# Patient Record
Sex: Male | Born: 1945 | Race: Black or African American | Hispanic: No | Marital: Married | Smoking: Never smoker
Health system: Southern US, Community
[De-identification: ages and names within clinical notes are randomized; demographics above are authoritative.]

## PROBLEM LIST (undated history)

## (undated) DIAGNOSIS — I10 Essential (primary) hypertension: Secondary | ICD-10-CM

## (undated) DIAGNOSIS — N289 Disorder of kidney and ureter, unspecified: Secondary | ICD-10-CM

## (undated) DIAGNOSIS — Z992 Dependence on renal dialysis: Secondary | ICD-10-CM

## (undated) DIAGNOSIS — E119 Type 2 diabetes mellitus without complications: Secondary | ICD-10-CM

## (undated) HISTORY — PX: AV FISTULA PLACEMENT: SHX1204

## (undated) HISTORY — PX: KIDNEY TRANSPLANT: SHX239

---

## 2019-12-27 ENCOUNTER — Other Ambulatory Visit: Payer: Self-pay

## 2019-12-27 ENCOUNTER — Emergency Department (HOSPITAL_BASED_OUTPATIENT_CLINIC_OR_DEPARTMENT_OTHER): Payer: Medicare HMO

## 2019-12-27 ENCOUNTER — Encounter (HOSPITAL_BASED_OUTPATIENT_CLINIC_OR_DEPARTMENT_OTHER): Payer: Self-pay

## 2019-12-27 ENCOUNTER — Observation Stay (HOSPITAL_BASED_OUTPATIENT_CLINIC_OR_DEPARTMENT_OTHER)
Admission: EM | Admit: 2019-12-27 | Discharge: 2019-12-30 | Disposition: A | Discharge status: Home / Self Care | Payer: Medicare HMO | Attending: Urology | Admitting: Urology

## 2019-12-27 DIAGNOSIS — I120 Hypertensive chronic kidney disease with stage 5 chronic kidney disease or end stage renal disease: Secondary | ICD-10-CM | POA: Insufficient documentation

## 2019-12-27 DIAGNOSIS — Z79899 Other long term (current) drug therapy: Secondary | ICD-10-CM | POA: Insufficient documentation

## 2019-12-27 DIAGNOSIS — S301XXA Contusion of abdominal wall, initial encounter: Secondary | ICD-10-CM | POA: Insufficient documentation

## 2019-12-27 DIAGNOSIS — Z992 Dependence on renal dialysis: Secondary | ICD-10-CM | POA: Insufficient documentation

## 2019-12-27 DIAGNOSIS — S3131XA Laceration without foreign body of scrotum and testes, initial encounter: Principal | ICD-10-CM | POA: Insufficient documentation

## 2019-12-27 DIAGNOSIS — E1122 Type 2 diabetes mellitus with diabetic chronic kidney disease: Secondary | ICD-10-CM | POA: Insufficient documentation

## 2019-12-27 DIAGNOSIS — Z23 Encounter for immunization: Secondary | ICD-10-CM | POA: Insufficient documentation

## 2019-12-27 DIAGNOSIS — K8020 Calculus of gallbladder without cholecystitis without obstruction: Secondary | ICD-10-CM | POA: Diagnosis not present

## 2019-12-27 DIAGNOSIS — Z794 Long term (current) use of insulin: Secondary | ICD-10-CM | POA: Insufficient documentation

## 2019-12-27 DIAGNOSIS — Y9389 Activity, other specified: Secondary | ICD-10-CM | POA: Insufficient documentation

## 2019-12-27 DIAGNOSIS — Z7952 Long term (current) use of systemic steroids: Secondary | ICD-10-CM | POA: Diagnosis not present

## 2019-12-27 DIAGNOSIS — N281 Cyst of kidney, acquired: Secondary | ICD-10-CM | POA: Diagnosis not present

## 2019-12-27 DIAGNOSIS — N186 End stage renal disease: Secondary | ICD-10-CM | POA: Diagnosis present

## 2019-12-27 DIAGNOSIS — W010XXA Fall on same level from slipping, tripping and stumbling without subsequent striking against object, initial encounter: Secondary | ICD-10-CM | POA: Insufficient documentation

## 2019-12-27 DIAGNOSIS — Z7982 Long term (current) use of aspirin: Secondary | ICD-10-CM | POA: Insufficient documentation

## 2019-12-27 DIAGNOSIS — T148XXA Other injury of unspecified body region, initial encounter: Secondary | ICD-10-CM | POA: Diagnosis present

## 2019-12-27 DIAGNOSIS — E875 Hyperkalemia: Secondary | ICD-10-CM | POA: Insufficient documentation

## 2019-12-27 DIAGNOSIS — Z20822 Contact with and (suspected) exposure to covid-19: Secondary | ICD-10-CM | POA: Insufficient documentation

## 2019-12-27 DIAGNOSIS — D631 Anemia in chronic kidney disease: Secondary | ICD-10-CM | POA: Insufficient documentation

## 2019-12-27 DIAGNOSIS — R262 Difficulty in walking, not elsewhere classified: Secondary | ICD-10-CM | POA: Insufficient documentation

## 2019-12-27 DIAGNOSIS — S3022XA Contusion of scrotum and testes, initial encounter: Secondary | ICD-10-CM

## 2019-12-27 DIAGNOSIS — Z940 Kidney transplant status: Secondary | ICD-10-CM | POA: Insufficient documentation

## 2019-12-27 DIAGNOSIS — Z7902 Long term (current) use of antithrombotics/antiplatelets: Secondary | ICD-10-CM | POA: Diagnosis not present

## 2019-12-27 DIAGNOSIS — Y9289 Other specified places as the place of occurrence of the external cause: Secondary | ICD-10-CM | POA: Insufficient documentation

## 2019-12-27 HISTORY — DX: Type 2 diabetes mellitus without complications: E11.9

## 2019-12-27 HISTORY — DX: Essential (primary) hypertension: I10

## 2019-12-27 HISTORY — DX: Dependence on renal dialysis: Z99.2

## 2019-12-27 HISTORY — DX: Disorder of kidney and ureter, unspecified: N28.9

## 2019-12-27 LAB — BASIC METABOLIC PANEL
Anion gap: 13 (ref 5–15)
BUN: 35 mg/dL — ABNORMAL HIGH (ref 8–23)
CO2: 24 mmol/L (ref 22–32)
Calcium: 8.4 mg/dL — ABNORMAL LOW (ref 8.9–10.3)
Chloride: 101 mmol/L (ref 98–111)
Creatinine, Ser: 8.39 mg/dL — ABNORMAL HIGH (ref 0.61–1.24)
GFR calc Af Amer: 7 mL/min — ABNORMAL LOW (ref >60)
GFR calc non Af Amer: 6 mL/min — ABNORMAL LOW (ref >60)
Glucose, Bld: 153 mg/dL — ABNORMAL HIGH (ref 70–99)
Potassium: 4.8 mmol/L (ref 3.5–5.1)
Sodium: 138 mmol/L (ref 135–145)

## 2019-12-27 LAB — CBC
HCT: 43.4 % (ref 39.0–52.0)
Hemoglobin: 13.9 g/dL (ref 13.0–17.0)
MCH: 32.6 pg (ref 26.0–34.0)
MCHC: 32 g/dL (ref 30.0–36.0)
MCV: 101.6 fL — ABNORMAL HIGH (ref 80.0–100.0)
Platelets: 183 10*3/uL (ref 150–400)
RBC: 4.27 MIL/uL (ref 4.22–5.81)
RDW: 16.7 % — ABNORMAL HIGH (ref 11.5–15.5)
WBC: 7.2 10*3/uL (ref 4.0–10.5)
nRBC: 0 % (ref 0.0–0.2)

## 2019-12-27 LAB — RESPIRATORY PANEL BY RT PCR (FLU A&B, COVID)
Influenza A by PCR: NEGATIVE
Influenza B by PCR: NEGATIVE
SARS Coronavirus 2 by RT PCR: NEGATIVE

## 2019-12-27 LAB — CBG MONITORING, ED: Glucose-Capillary: 106 mg/dL — ABNORMAL HIGH (ref 70–99)

## 2019-12-27 MED ORDER — MORPHINE SULFATE (PF) 4 MG/ML IV SOLN
4.0000 mg | Freq: Once | INTRAVENOUS | Status: AC
  Administered 2019-12-27: 19:00:00 4 mg via INTRAVENOUS
  Filled 2019-12-27: qty 1

## 2019-12-27 MED ORDER — SODIUM CHLORIDE 0.9 % IV SOLN: INTRAVENOUS | Status: DC

## 2019-12-27 MED ORDER — TETANUS-DIPHTH-ACELL PERTUSSIS 5-2.5-18.5 LF-MCG/0.5 IM SUSP
0.5000 mL | Freq: Once | INTRAMUSCULAR | Status: AC
  Administered 2019-12-27: 0.5 mL via INTRAMUSCULAR
  Filled 2019-12-27: qty 0.5

## 2019-12-27 MED ORDER — ONDANSETRON HCL 4 MG/2ML IJ SOLN
4.0000 mg | Freq: Once | INTRAMUSCULAR | Status: AC
  Administered 2019-12-27: 4 mg via INTRAVENOUS
  Filled 2019-12-27: qty 2

## 2019-12-27 MED ORDER — HYDROMORPHONE HCL 1 MG/ML IJ SOLN
1.0000 mg | Freq: Once | INTRAMUSCULAR | Status: AC
  Administered 2019-12-27: 1 mg via INTRAVENOUS
  Filled 2019-12-27: qty 1

## 2019-12-27 MED ORDER — CEFAZOLIN SODIUM-DEXTROSE 1-4 GM/50ML-% IV SOLN
1.0000 g | Freq: Once | INTRAVENOUS | Status: AC
  Administered 2019-12-27: 1 g via INTRAVENOUS
  Filled 2019-12-27: qty 50

## 2019-12-27 MED ORDER — IOHEXOL 300 MG/ML  SOLN
100.0000 mL | Freq: Once | INTRAMUSCULAR | Status: AC | PRN
  Administered 2019-12-27: 20:00:00 100 mL via INTRAVENOUS

## 2019-12-27 NOTE — ED Provider Notes (Signed)
[facility] EMERGENCY DEPARTMENT Provider Note   CSN: [id number] Arrival date & time: 12/27/19  1810     History Chief Complaint  Patient presents with  . Fall    Mathew Pierce is a 74 y.o. male.  Patient presents s/p fall and injury to right scrotum and pelvis area. Pt indicates was fishing this afternoon, when tripped on the pole, and fell back into a pole that was sticking upwards from a wagon. Pole impaled him in region right scrotum, and he c/o pain to area, blood in underwear, and swelling to right groin and suprapubic area. Symptoms acute onset, moderate, persistent, dull. Denies large volume of bleeding/stopped on its own. 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 No nausea/vomiting. No head injury or headache. No neck or back pain. No anticoag use. Denies other pain or injury. Last tetanus unknown.   The history is provided by the patient.  Fall Pertinent negatives include no chest pain, no abdominal pain, no headaches and no shortness of breath.       Past Medical History:  Diagnosis Date  . Diabetes mellitus without complication ([place])   . Dialysis patient ([place])   . Hypertension   . Renal disorder     There are no problems to display for this patient.   History reviewed. No pertinent surgical history.     No family history on file.  Social History   Tobacco Use  . Smoking status: Never Smoker  . Smokeless tobacco: Never Used  Substance Use Topics  . Alcohol use: Never  . Drug use: Never    Home Medications Prior to Admission medications   Not on File    Allergies    Patient has no known allergies.  Review of Systems   Review of Systems  Constitutional: Negative for fever.  HENT: Negative for nosebleeds.   Eyes: Negative for redness.  Respiratory: Negative for shortness of breath.   Cardiovascular: Negative for chest pain.  Gastrointestinal: Negative for  abdominal pain, nausea and vomiting.  Genitourinary: Positive for scrotal swelling. Negative for flank pain.  Musculoskeletal: Negative for back pain and neck pain.  Skin: Positive for wound.  Neurological: Negative for weakness, numbness and headaches.  Hematological: Does not bruise/bleed easily.  Psychiatric/Behavioral: Negative for confusion.    Physical Exam Updated Vital Signs BP (!) 149/76 (BP Location: Left Arm)   Pulse 76   Temp 97.6 F (36.4 C) (Oral)   Resp 18   Ht 1.702 m (5\' 7" )   Wt 79.8 kg   SpO2 98%   BMI 27.57 kg/m   Physical Exam Vitals and nursing note reviewed.  Constitutional:      Appearance: Normal appearance. He is well-developed.  HENT:     Head: Atraumatic.     Nose: Nose normal.     Mouth/Throat:     Mouth: Mucous membranes are moist.     Pharynx: Oropharynx is clear.  Eyes:     General: No scleral icterus.    Conjunctiva/sclera: Conjunctivae normal.     Pupils: Pupils are equal, round, and reactive to light.  Neck:     Trachea: No tracheal deviation.  Cardiovascular:     Rate and Rhythm: Normal rate and regular rhythm.     Pulses: Normal pulses.     Heart sounds: Normal heart sounds. No murmur. No friction rub. No gallop.   Pulmonary:     Effort: Pulmonary effort  is normal. No accessory muscle usage or respiratory distress.     Breath sounds: Normal breath sounds.  Chest:     Chest wall: No tenderness.  Abdominal:     General: Bowel sounds are normal. There is no distension.     Palpations: Abdomen is soft.     Tenderness: There is no abdominal tenderness. There is no guarding.     Comments: No abd wall bruising or contusion. +suprapubic swelling ?hematoma.   Genitourinary:    Comments: No cva tenderness. 3 cm laceration to inferior aspect right scrotum. Swelling to right scrotum, right groin, and suprapubic area.  Musculoskeletal:        General: No swelling.     Cervical back: Normal range of motion and neck supple. No rigidity.      Comments: CTLS spine, non tender, aligned, no step off. Good rom bil extremities without pain or other focal bony tenderness.  Skin:    General: Skin is warm and dry.     Findings: No rash.  Neurological:     Mental Status: He is alert.     Comments: Alert, speech clear. Motor/sens grossly intact. Steady gait.   Psychiatric:        Mood and Affect: Mood normal.     ED Results / Procedures / Treatments   Labs (all labs ordered are listed, but only abnormal results are displayed) Results for orders placed or performed during the hospital encounter of 12/27/19  CBC  Result Value Ref Range   WBC 7.2 4.0 - 10.5 K/uL   RBC 4.27 4.22 - 5.81 MIL/uL   Hemoglobin 13.9 13.0 - 17.0 g/dL   HCT 43.4 39.0 - 52.0 %   MCV 101.6 (H) 80.0 - 100.0 fL   MCH 32.6 26.0 - 34.0 pg   MCHC 32.0 30.0 - 36.0 g/dL   RDW 16.7 (H) 11.5 - 15.5 %   Platelets 183 150 - 400 K/uL   nRBC 0.0 0.0 - 0.2 %  Basic metabolic panel  Result Value Ref Range   Sodium 138 135 - 145 mmol/L   Potassium 4.8 3.5 - 5.1 mmol/L   Chloride 101 98 - 111 mmol/L   CO2 24 22 - 32 mmol/L   Glucose, Bld 153 (H) 70 - 99 mg/dL   BUN 35 (H) 8 - 23 mg/dL   Creatinine, Ser 8.39 (H) 0.61 - 1.24 mg/dL   Calcium 8.4 (L) 8.9 - 10.3 mg/dL   GFR calc non Af Amer 6 (L) >60 mL/min   GFR calc Af Amer 7 (L) >60 mL/min   Anion gap 13 5 - 15  CBG monitoring, ED  Result Value Ref Range   Glucose-Capillary 106 (H) 70 - 99 mg/dL   CT Abdomen Pelvis W Contrast  Addendum Date: 12/27/2019   ADDENDUM REPORT: 12/27/2019 20:54 ADDENDUM: Results were discussed with Dr. Arie Sabina at 8:42 p.m. [place] on December 27, 2019. Electronically Signed   By: Virgina Norfolk M.D.   On: 12/27/2019 20:54   Result Date: 12/27/2019 CLINICAL DATA:  Status post trauma. EXAM: CT ABDOMEN AND PELVIS WITH CONTRAST TECHNIQUE: Multidetector CT imaging of the abdomen and pelvis was performed using the standard protocol following bolus administration of intravenous contrast.  CONTRAST:  146mL OMNIPAQUE IOHEXOL 300 MG/ML  SOLN COMPARISON:  April 21, 2019 FINDINGS: Lower chest: No acute abnormality. Hepatobiliary: No focal liver abnormality is seen. Gallstones and heterogeneous sludge are seen within the lumen of a contracted gallbladder. There is no evidence of biliary dilatation.  Pancreas: Unremarkable. No pancreatic ductal dilatation or surrounding inflammatory changes. Spleen: Normal in size without focal abnormality. Adrenals/Urinary Tract: Adrenal glands are unremarkable. The native kidneys are very small, without renal calculi or hydronephrosis. Multiple bilateral renal cysts are seen. A renal transplant is seen within the right hemipelvis. Diffuse cortical thinning of the transplanted kidney is noted. 1.2 cm and 1.0 cm renal cysts are also seen. Mild diffuse urinary bladder wall thickening is seen. Stomach/Bowel: Stomach is within normal limits. Appendix appears normal. No evidence of bowel wall thickening, distention, or inflammatory changes. Vascular/Lymphatic: There is moderate to marked severity aortic calcification and atherosclerosis. No enlarged abdominal or pelvic lymph nodes. Reproductive: Prostate is unremarkable. Other: A moderate to marked amount of para muscular soft tissue air is seen within the deep subcutaneous fat along the anterior aspect of the lower pelvic wall on the right. This extends into the right inguinal region and is seen along the superior aspect of the scrotum on the right. 6.4 cm x 4.6 cm and 2.8 cm x 2.2 cm areas of mildly increased attenuation are also seen within this portion of subcutaneous fat. A 2.3 cm linear area of contrast enhancement is seen anterior to these areas (axial CT images 78 through 80, CT series number 2). The larger area of increased attenuation extends inferiorly along the right groin and superior aspect of the scrotum on the right. A moderate to marked amount of surrounding inflammatory fat stranding is seen. A moderate amount  of heterogeneous increased attenuation is noted throughout the scrotum on the right. Diffuse scrotal wall edema is also seen. The right testicle is poorly identified. Musculoskeletal: Moderate severity multilevel changes seen throughout the lumbar spine. IMPRESSION: 1. Moderate to marked amount of lower right anterior pelvic wall, right inguinal and right scrotal soft tissue air with associated hematomas and acute scrotal blood. The 2.3 cm linear area of contrast enhancement seen anterior to the pelvic wall and inguinal hematomas is consistent with a focus of active bleeding. Subsequent rupture of the right testicle cannot be excluded. 2. Cholelithiasis and heterogeneous sludge within a contracted gallbladder. 3. 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 4. Moderate to marked severity aortic calcification and atherosclerosis. 5. Mild diffuse urinary bladder wall thickening. Correlation with urinalysis is recommended to exclude cystitis. Aortic Atherosclerosis (ICD10-I70.0). Electronically Signed: By: Virgina Norfolk M.D. On: 12/27/2019 20:47    EKG None  Radiology No results found.  Procedures Procedures (including critical care time)  Medications Ordered in ED Medications  0.9 %  sodium chloride infusion (has no administration in time range)  Tdap (BOOSTRIX) injection 0.5 mL (has no administration in time range)  ceFAZolin (ANCEF) IVPB 1 g/50 mL premix (has no administration in time range)  morphine 4 MG/ML injection 4 mg (4 mg Intravenous Given 12/27/19 1857)  ondansetron (ZOFRAN) injection 4 mg (4 mg Intravenous Given 12/27/19 1856)    ED Course  I have reviewed the triage vital signs and the nursing notes.  Pertinent labs & imaging results that were available during my care of the patient were reviewed by me and considered in my medical decision making (see chart for details).        MDM Rules/Calculators/A&P                      Iv  ns. Morphine iv. zofran iv. Tetanus im. Labs and imaging ordered.   Reviewed nursing notes and prior charts for additional history.   Pain  improved w meds.   Labs reviewed/interpreted by me - hgb normal.   CT reviewed/interpreted by me - large scrotal, inguinal/suprapubic hematomas w active extravasation. ?right testicle injury vs displacement.   Urology consulted - discussed w Dr Junious Silk - he reviewed CTs and states transfer to trauma service at [facility]. He will consult.   Discussed with trauma MD at [facility], Dr Bobbye Morton - she accepts in transfer - they will eval in ED and decide on observation vs possible IR procedure then.   Pt agreeable w transfer to [facility].   Ancef iv.   Additional pain medication given.   CRITICAL CARE RE: acute injury to right scrotum, with laceration to scrotum, large hematoma with active extravasation requiring emergent trauma surgeon and urology consultation, transfer to trauma center for possible exploration and/or IR procedure, ESRD/HD patient.  Performed by: Mirna Mires Total critical care time: 40 minutes Critical care time was exclusive of separately billable procedures and treating other patients. Critical care was necessary to treat or prevent imminent or life-threatening deterioration. Critical care was time spent personally by me on the following activities: development of treatment plan with patient and/or surrogate as well as nursing, discussions with consultants, evaluation of patient's response to treatment, examination of patient, obtaining history from patient or surrogate, ordering and performing treatments and interventions, ordering and review of laboratory studies, ordering and review of radiographic studies, pulse oximetry and re-evaluation of patient's condition.      Final Clinical Impression(s) / ED Diagnoses Final diagnoses:  None    Rx / DC Orders ED Discharge Orders    None       Lajean Saver, MD 12/27/19 2134

## 2019-12-27 NOTE — ED Provider Notes (Signed)
Pt transferred from [facility] for a trauma eval.  Pt d/w Dr. Bobbye Morton who will see him.   Isla Pence, MD 12/27/19 2302

## 2019-12-27 NOTE — ED Notes (Signed)
[facility] notified (Taryn) - patient ready for transport 

## 2019-12-27 NOTE — H&P (Addendum)
TRAUMA H&P  12/27/2019, 11:04 PM   Chief Complaint: scrotal bleeding, transfer from [facility] Consultant: Gilford Raid, MD  The patient is an 74 y.o. male.   HPI: 79M s/p mechanical GLF while out fishing this evening. Reports tripping over his fishing rods and falling onto a wagon that had poles extending upward at each corner. Denies hitting/injuring anything other than his scrotum/groin. Denies LOC, no CP/SOB/dizziness prior to fall. Dialysis patient, RUE AVF, TTS, last complete session 3/20.  Past Medical History:  Diagnosis Date  . Diabetes mellitus without complication ([place])   . Dialysis patient ([place])   . Hypertension   . Renal disorder     History reviewed. No pertinent surgical history.  No pertinent family history.  Social History:  reports that he has never smoked. He has never used smokeless tobacco. He reports that he does not drink alcohol or use drugs.    Allergies: No Known Allergies  Medications: reviewed  Results for orders placed or performed during the hospital encounter of 12/27/19 (from the past 48 hour(s))  CBC     Status: Abnormal   Collection Time: 12/27/19  7:00 PM  Result Value Ref Range   WBC 7.2 4.0 - 10.5 K/uL   RBC 4.27 4.22 - 5.81 MIL/uL   Hemoglobin 13.9 13.0 - 17.0 g/dL   HCT 43.4 39.0 - 52.0 %   MCV 101.6 (H) 80.0 - 100.0 fL   MCH 32.6 26.0 - 34.0 pg   MCHC 32.0 30.0 - 36.0 g/dL   RDW 16.7 (H) 11.5 - 15.5 %   Platelets 183 150 - 400 K/uL   nRBC 0.0 0.0 - 0.2 %    Comment: Performed at [facility], [address] [place], [place] [address]  Basic metabolic panel     Status: Abnormal   Collection Time: 12/27/19  7:00 PM  Result Value Ref Range   Sodium 138 135 - 145 mmol/L   Potassium 4.8 3.5 - 5.1 mmol/L   Chloride 101 98 - 111 mmol/L   CO2 24 22 - 32 mmol/L   Glucose, Bld 153 (H) 70 - 99 mg/dL    Comment: Glucose reference range applies only to samples taken after fasting for at least 8 hours.   BUN 35 (H) 8 - 23 mg/dL   Creatinine, Ser 8.39 (H) 0.61 - 1.24 mg/dL   Calcium 8.4 (L) 8.9 - 10.3 mg/dL   GFR calc non Af Amer 6 (L) >60 mL/min   GFR calc Af Amer 7 (L) >60 mL/min   Anion gap 13 5 - 15    Comment: Performed at [facility], [address] [place], [place] [address]  CBG monitoring, ED     Status: Abnormal   Collection Time: 12/27/19  9:08 PM  Result Value Ref Range   Glucose-Capillary 106 (H) 70 - 99 mg/dL    Comment: Glucose reference range applies only to samples taken after fasting for at least 8 hours.  Respiratory Panel by RT PCR (Flu A&B, Covid) - Nasopharyngeal Swab     Status: None   Collection Time: 12/27/19  9:53 PM   Specimen: Nasopharyngeal Swab  Result Value Ref Range   SARS Coronavirus 2 by RT PCR NEGATIVE NEGATIVE    Comment: (NOTE) SARS-CoV-2 target nucleic acids are NOT DETECTED. The SARS-CoV-2 RNA is generally detectable in upper respiratoy specimens during the acute phase of infection. The lowest concentration of SARS-CoV-2 viral copies this assay can detect is 131 copies/mL. A  negative result does not preclude SARS-Cov-2 infection and should not be used as the sole basis for treatment or other patient management decisions. A negative result may occur with  improper specimen collection/handling, submission of specimen other than nasopharyngeal swab, presence of viral mutation(s) within the areas targeted by this assay, and inadequate number of viral copies (<131 copies/mL). A negative result must be combined with clinical observations, patient history, and epidemiological information. The expected result is Negative. Fact Sheet for Patients:  PinkCheek.be Fact Sheet for Healthcare Providers:  GravelBags.it This test is not yet ap proved or cleared by the [place] FDA and  has been authorized for detection and/or diagnosis of SARS-CoV-2 by FDA under an Emergency Use Authorization (EUA). This EUA  will remain  in effect (meaning this test can be used) for the duration of the COVID-19 declaration under Section 564(b)(1) of the Act, 21 U.S.C. section 360bbb-3(b)(1), unless the authorization is terminated or revoked sooner.    Influenza A by PCR NEGATIVE NEGATIVE   Influenza B by PCR NEGATIVE NEGATIVE    Comment: (NOTE) The Xpert Xpress SARS-CoV-2/FLU/RSV assay is intended as an aid in  the diagnosis of influenza from Nasopharyngeal swab specimens and  should not be used as a sole basis for treatment. Nasal washings and  aspirates are unacceptable for Xpert Xpress SARS-CoV-2/FLU/RSV  testing. Fact Sheet for Patients: PinkCheek.be Fact Sheet for Healthcare Providers: GravelBags.it This test is not yet approved or cleared by the [place] FDA and  has been authorized for detection and/or diagnosis of SARS-CoV-2 by  FDA under an Emergency Use Authorization (EUA). This EUA will remain  in effect (meaning this test can be used) for the duration of the  Covid-19 declaration under Section 564(b)(1) of the Act, 21  U.S.C. section 360bbb-3(b)(1), unless the authorization is  terminated or revoked. Performed at [facility], [address] [place], [place] [address]     CT Abdomen Pelvis W Contrast  Addendum Date: 12/27/2019   ADDENDUM REPORT: 12/27/2019 20:54 ADDENDUM: Results were discussed with Dr. Arie Sabina at 8:42 p.m. [place] on December 27, 2019. Electronically Signed   By: Virgina Norfolk M.D.   On: 12/27/2019 20:54   Result Date: 12/27/2019 CLINICAL DATA:  Status post trauma. EXAM: CT ABDOMEN AND PELVIS WITH CONTRAST TECHNIQUE: Multidetector CT imaging of the abdomen and pelvis was performed using the standard protocol following bolus administration of intravenous contrast. CONTRAST:  15mL OMNIPAQUE IOHEXOL 300 MG/ML  SOLN COMPARISON:  April 21, 2019 FINDINGS: Lower chest: No acute abnormality.  Hepatobiliary: No focal liver abnormality is seen. Gallstones and heterogeneous sludge are seen within the lumen of a contracted gallbladder. There is no evidence of biliary dilatation. Pancreas: Unremarkable. No pancreatic ductal dilatation or surrounding inflammatory changes. Spleen: Normal in size without focal abnormality. Adrenals/Urinary Tract: Adrenal glands are unremarkable. The native kidneys are very small, without renal calculi or hydronephrosis. Multiple bilateral renal cysts are seen. A renal transplant is seen within the right hemipelvis. Diffuse cortical thinning of the transplanted kidney is noted. 1.2 cm and 1.0 cm renal cysts are also seen. Mild diffuse urinary bladder wall thickening is seen. Stomach/Bowel: Stomach is within normal limits. Appendix appears normal. No evidence of bowel wall thickening, distention, or inflammatory changes. Vascular/Lymphatic: There is moderate to marked severity aortic calcification and atherosclerosis. No enlarged abdominal or pelvic lymph nodes. Reproductive: Prostate is unremarkable. Other: A moderate to marked amount of para muscular soft tissue air is seen within the deep subcutaneous fat  along the anterior aspect of the lower pelvic wall on the right. This extends into the right inguinal region and is seen along the superior aspect of the scrotum on the right. 6.4 cm x 4.6 cm and 2.8 cm x 2.2 cm areas of mildly increased attenuation are also seen within this portion of subcutaneous fat. A 2.3 cm linear area of contrast enhancement is seen anterior to these areas (axial CT images 78 through 80, CT series number 2). The larger area of increased attenuation extends inferiorly along the right groin and superior aspect of the scrotum on the right. A moderate to marked amount of surrounding inflammatory fat stranding is seen. A moderate amount of heterogeneous increased attenuation is noted throughout the scrotum on the right. Diffuse scrotal wall edema is also  seen. The right testicle is poorly identified. Musculoskeletal: Moderate severity multilevel changes seen throughout the lumbar spine. IMPRESSION: 1. Moderate to marked amount of lower right anterior pelvic wall, right inguinal and right scrotal soft tissue air with associated hematomas and acute scrotal blood. The 2.3 cm linear area of contrast enhancement seen anterior to the pelvic wall and inguinal hematomas is consistent with a focus of active bleeding. Subsequent rupture of the right testicle cannot be excluded. 2. Cholelithiasis and heterogeneous sludge within a contracted gallbladder. 3. 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 4. Moderate to marked severity aortic calcification and atherosclerosis. 5. Mild diffuse urinary bladder wall thickening. Correlation with urinalysis is recommended to exclude cystitis. Aortic Atherosclerosis (ICD10-I70.0). Electronically Signed: By: Virgina Norfolk M.D. On: 12/27/2019 20:47    ROS 10 point review of systems is negative except as listed above in HPI.  Blood pressure (!) 163/62, pulse 63, temperature 98 F (36.7 C), temperature source Oral, resp. rate 16, height 5\' 7"  (1.702 m), weight 79.8 kg, SpO2 97 %.  Secondary Survey:  GCS: E(4)//V(5)//M(6) Constitutional: well-developed, well-nourished Skull: normocephalic, atraumatic Eyes: pupils equal, round, reactive to light, 54mm b/l, moist conjunctiva Face/ENT: midface stable without deformity, normal dentition ENT: external inspection of ears and nose normal, hearing intact Oropharynx: normal oropharyngeal mucosa, no blood Neck: no thyromegaly, trachea midline, c-collar not applied due to mechanism, no midline cervical tenderness to palpation, no C-spine stepoffs Chest: breath sounds equal bilaterally, normal respiratory effort, no midline or lateral chest wall tenderness to palpation/deformity Abdomen: soft, NT, no bruising, no hepatosplenomegaly FAST:  not performed Pelvis: stable GU: no blood at urethral meatus of penis, R scrotum tender with 6cm V-shaped laceration, hemostatic, R groin with tender 4cm hematoma present, no overlying skin breakdown Back: no wounds, no T/L spine TTP, no T/L spine stepoffs Rectal: deferred Extremities: 2+ radial pulses bilaterally, pedal pulses equal bilaterally, motor and sensation intact to bilateral UE and LE, no peripheral edema, RUE AVF with thrill MSK: unable to assess gait/station no clubbing/cyanosis of fingers/toes, limited ROM of RLE 2/2 pain, otherwise normal ROM of remaining extremities, no deformities Skin: warm, dry, no rashes    Assessment/Plan: Problem List 20M s/p GLF  Plan Groin/scrotal hematoma with active extravasation on imaging - serial CBC, IR c/s for angio, warm compresses Scrotal laceration, concern for ruptured testicle on imaging - Urology c/s (Dr. Junious Silk) ESRD on HD - c/s Renal in AM for HD on 3/23 FEN - CLD DVT - SCDs, hold chemical ppx due to clinical condition Dispo - Admit to inpatient--floor  Family update: wife at bedside, updated  Jesusita Oka, MD General and Junior Surgery

## 2019-12-27 NOTE — ED Notes (Signed)
Pt. Doesn't make urine, except for a few drops every 4-5 days at best.

## 2019-12-27 NOTE — ED Triage Notes (Signed)
Pt arrives with report of a fall about an hour PTA. States that he fell on a pole and has had some bleeding, unsure of where it punctured. Pt states his tetanus shot is up to date in last five years. Pt is on T, TH., Sat, dialysis. Pt had full treatment yesterday, access is in right arm.

## 2019-12-27 NOTE — ED Notes (Addendum)
CBG 106 

## 2019-12-27 NOTE — ED Notes (Signed)
ED Provider at bedside. 

## 2019-12-27 NOTE — ED Notes (Signed)
PT is on Dialysis and does not urinate every day.

## 2019-12-28 DIAGNOSIS — T148XXA Other injury of unspecified body region, initial encounter: Secondary | ICD-10-CM | POA: Diagnosis present

## 2019-12-28 LAB — CBC
HCT: 38.9 % — ABNORMAL LOW (ref 39.0–52.0)
HCT: 37.7 % — ABNORMAL LOW (ref 39.0–52.0)
HCT: 32.8 % — ABNORMAL LOW (ref 39.0–52.0)
Hemoglobin: 12.2 g/dL — ABNORMAL LOW (ref 13.0–17.0)
Hemoglobin: 12.2 g/dL — ABNORMAL LOW (ref 13.0–17.0)
Hemoglobin: 10.5 g/dL — ABNORMAL LOW (ref 13.0–17.0)
MCH: 31.5 pg (ref 26.0–34.0)
MCH: 32 pg (ref 26.0–34.0)
MCH: 31.6 pg (ref 26.0–34.0)
MCHC: 31.4 g/dL (ref 30.0–36.0)
MCHC: 32.4 g/dL (ref 30.0–36.0)
MCHC: 32 g/dL (ref 30.0–36.0)
MCV: 100.5 fL — ABNORMAL HIGH (ref 80.0–100.0)
MCV: 99 fL (ref 80.0–100.0)
MCV: 98.8 fL (ref 80.0–100.0)
Platelets: 179 10*3/uL (ref 150–400)
Platelets: 141 10*3/uL — ABNORMAL LOW (ref 150–400)
Platelets: 141 10*3/uL — ABNORMAL LOW (ref 150–400)
RBC: 3.87 MIL/uL — ABNORMAL LOW (ref 4.22–5.81)
RBC: 3.81 MIL/uL — ABNORMAL LOW (ref 4.22–5.81)
RBC: 3.32 MIL/uL — ABNORMAL LOW (ref 4.22–5.81)
RDW: 16.7 % — ABNORMAL HIGH (ref 11.5–15.5)
RDW: 16.7 % — ABNORMAL HIGH (ref 11.5–15.5)
RDW: 16.7 % — ABNORMAL HIGH (ref 11.5–15.5)
WBC: 10.1 10*3/uL (ref 4.0–10.5)
WBC: 6.2 10*3/uL (ref 4.0–10.5)
WBC: 6.4 10*3/uL (ref 4.0–10.5)
nRBC: 0 % (ref 0.0–0.2)
nRBC: 0 % (ref 0.0–0.2)
nRBC: 0 % (ref 0.0–0.2)

## 2019-12-28 LAB — GLUCOSE, CAPILLARY
Glucose-Capillary: 125 mg/dL — ABNORMAL HIGH (ref 70–99)
Glucose-Capillary: 104 mg/dL — ABNORMAL HIGH (ref 70–99)
Glucose-Capillary: 133 mg/dL — ABNORMAL HIGH (ref 70–99)
Glucose-Capillary: 143 mg/dL — ABNORMAL HIGH (ref 70–99)
Glucose-Capillary: 104 mg/dL — ABNORMAL HIGH (ref 70–99)

## 2019-12-28 LAB — BASIC METABOLIC PANEL
Anion gap: 16 — ABNORMAL HIGH (ref 5–15)
BUN: 43 mg/dL — ABNORMAL HIGH (ref 8–23)
CO2: 19 mmol/L — ABNORMAL LOW (ref 22–32)
Calcium: 8.2 mg/dL — ABNORMAL LOW (ref 8.9–10.3)
Chloride: 99 mmol/L (ref 98–111)
Creatinine, Ser: 9.55 mg/dL — ABNORMAL HIGH (ref 0.61–1.24)
GFR calc Af Amer: 6 mL/min — ABNORMAL LOW (ref >60)
GFR calc non Af Amer: 5 mL/min — ABNORMAL LOW (ref >60)
Glucose, Bld: 136 mg/dL — ABNORMAL HIGH (ref 70–99)
Potassium: 5.7 mmol/L — ABNORMAL HIGH (ref 3.5–5.1)
Sodium: 134 mmol/L — ABNORMAL LOW (ref 135–145)

## 2019-12-28 MED ORDER — ONDANSETRON 4 MG PO TBDP: 4.0000 mg | ORAL_TABLET | Freq: Four times a day (QID) | ORAL | Status: DC | PRN

## 2019-12-28 MED ORDER — ONDANSETRON HCL 4 MG/2ML IJ SOLN: 4.0000 mg | Freq: Four times a day (QID) | INTRAMUSCULAR | Status: DC | PRN

## 2019-12-28 MED ORDER — METHOCARBAMOL 500 MG PO TABS
1000.0000 mg | ORAL_TABLET | Freq: Three times a day (TID) | ORAL | Status: DC
  Administered 2019-12-28 – 2019-12-29 (×7): 1000 mg via ORAL
  Filled 2019-12-28 (×7): qty 2

## 2019-12-28 MED ORDER — MORPHINE SULFATE (PF) 4 MG/ML IV SOLN: 4.0000 mg | INTRAVENOUS | Status: DC | PRN

## 2019-12-28 MED ORDER — CHLORHEXIDINE GLUCONATE CLOTH 2 % EX PADS
6.0000 | MEDICATED_PAD | Freq: Every day | CUTANEOUS | Status: DC
  Administered 2019-12-29: 06:00:00 6 via TOPICAL

## 2019-12-28 MED ORDER — ACETAMINOPHEN 500 MG PO TABS
1000.0000 mg | ORAL_TABLET | Freq: Four times a day (QID) | ORAL | Status: DC
  Administered 2019-12-28 – 2019-12-30 (×9): 1000 mg via ORAL
  Filled 2019-12-28 (×9): qty 2

## 2019-12-28 MED ORDER — OXYCODONE HCL 5 MG/5ML PO SOLN: 5.0000 mg | ORAL | Status: DC | PRN

## 2019-12-28 MED ORDER — SODIUM ZIRCONIUM CYCLOSILICATE 10 G PO PACK
10.0000 g | PACK | Freq: Two times a day (BID) | ORAL | Status: DC
  Administered 2019-12-28 – 2019-12-29 (×3): 10 g via ORAL
  Filled 2019-12-28 (×3): qty 1

## 2019-12-28 MED ORDER — DOCUSATE SODIUM 100 MG PO CAPS
100.0000 mg | ORAL_CAPSULE | Freq: Two times a day (BID) | ORAL | Status: DC
  Administered 2019-12-28: 100 mg via ORAL
  Filled 2019-12-28 (×4): qty 1

## 2019-12-28 NOTE — Evaluation (Signed)
Occupational Therapy Evaluation Patient Details Name: Mathew Pierce MRN: [id number] DOB: May 12, 1946 Today's Date: 12/28/2019    History of Present Illness Mathew Pierce is a 74 year old male who presented to ED s/p fall resulting in groin/scrotal hematoma and right scrotal laceration closed with interrupted suture.    Clinical Impression   PTA, pt was living at home with his wife, and reports he was independent with ADL/IADL and functional mobility with intermittent use of his walking stick. Pt reports no addition falls at home. Pt currently demonstrates ability to complete ADL with supervision to minguard level for safety. Pt demonstrates minor instability but no loss of balance noted during session. Anticipate pt is close to his baseline. He will benefit from continued OT services acutely to maximize safety and independence with ADL/IADL and functional mobility. Anticipate pt will not need additional OT services following d/c. Will continue to follow acutely.     Follow Up Recommendations  No OT follow up;Supervision - Intermittent    Equipment Recommendations  None recommended by OT    Recommendations for Other Services       Precautions / Restrictions Precautions Precautions: Fall Required Braces or Orthoses: Other Brace Other Brace: Keep scrotal support (mesh underwear or jock strap) with fluffs when ambulating. - per physician note Restrictions Weight Bearing Restrictions: No      Mobility Bed Mobility Overal bed mobility: Modified Independent             General bed mobility comments: increased time and effort due to guarding surgical site  Transfers Overall transfer level: Needs assistance Equipment used: None Transfers: Sit to/from Stand Sit to Stand: Supervision         General transfer comment: supervision for safety;minor instabiltiy noted, no loss of balance;pt with decreased control of descent to sitting    Balance Overall balance assessment: Mild  deficits observed, not formally tested                                         ADL either performed or assessed with clinical judgement   ADL Overall ADL's : Needs assistance/impaired Eating/Feeding: Set up   Grooming: Supervision/safety;Standing   Upper Body Bathing: Supervision/ safety   Lower Body Bathing: Supervison/ safety   Upper Body Dressing : Supervision/safety   Lower Body Dressing: Supervision/safety;Sit to/from stand   Toilet Transfer: Supervision/safety;Ambulation   Toileting- Clothing Manipulation and Hygiene: Supervision/safety       Functional mobility during ADLs: Supervision/safety;Min guard General ADL Comments: anticipate pt is close to his baseline, pt reported he was at baseline;he required minguard to supervision level for functional mobiltiy;minor instability noted but no loss of balance;pt ambulated in hallway with intermittent use of walking stick that pt made;pt demonstrated ability to complete ADL with minguard assistance     Vision Baseline Vision/History: No visual deficits       Perception     Praxis      Pertinent Vitals/Pain Pain Assessment: No/denies pain     Hand Dominance Right   Extremity/Trunk Assessment Upper Extremity Assessment Upper Extremity Assessment: Overall WFL for tasks assessed   Lower Extremity Assessment Lower Extremity Assessment: Overall WFL for tasks assessed   Cervical / Trunk Assessment Cervical / Trunk Assessment: Normal   Communication Communication Communication: No difficulties   Cognition Arousal/Alertness: Awake/alert Behavior During Therapy: WFL for tasks assessed/performed Overall Cognitive Status: Within Functional Limits for tasks assessed  General Comments  vss    Exercises     Shoulder Instructions      Home Living Family/patient expects to be discharged to:: Private residence Living Arrangements:  Spouse/significant other;Children Available Help at Discharge: Family;Available 24 hours/day Type of Home: Apartment Home Access: Level entry     Home Layout: One level     Bathroom Shower/Tub: Teacher, early years/pre: Standard     Home Equipment: Cane - single point          Prior Functioning/Environment Level of Independence: Independent        Comments: pt was independent with ADL, family was assisting with driving;pt enjoys fishing and 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[place], Teacher, English as a foreign language, and [place])        OT Problem List: Impaired balance (sitting and/or standing)      OT Treatment/Interventions: Self-care/ADL training;Therapeutic exercise;Therapeutic activities;Balance training;Patient/family education    OT Goals(Current goals can be found in the care plan section) Acute Rehab OT Goals Patient Stated Goal: to go home OT Goal Formulation: With patient Time For Goal Achievement: 01/11/20 Potential to Achieve Goals: Good ADL Goals Pt Will Perform Lower Body Dressing: sit to/from stand;with modified independence Pt Will Perform Tub/Shower Transfer: with modified independence;ambulating Additional ADL Goal #1: Pt will demonstrate independence with 3 fall prevention strategies during ADL completion and functional mobiltiy.  OT Frequency: Min 2X/week   Barriers to D/C:            Co-evaluation              AM-PAC OT "6 Clicks" Daily Activity     Outcome Measure Help from another person eating meals?: None Help from another person taking care of personal grooming?: None Help from another person toileting, which includes using toliet, bedpan, or urinal?: None Help from another person bathing (including washing, rinsing, drying)?: None Help from another person to put on and taking off regular upper body clothing?: None Help from another person to put on and taking off regular lower body clothing?: None 6 Click Score: 24   End of Session Equipment Utilized  During Treatment: Gait belt(pt's walking stick) Nurse Communication: Mobility status  Activity Tolerance: Patient tolerated treatment well Patient left: in chair;with call bell/phone within reach;with chair alarm set  OT Visit Diagnosis: Unsteadiness on feet (R26.81);Other abnormalities of gait and mobility (R26.89);History of falling (Z91.81)                Time: 6144-3154 OT Time Calculation (min): 25 min Charges:  OT General Charges $OT Visit: 1 Visit OT Evaluation $OT Eval Moderate Complexity: 1 Mod OT Treatments $Self Care/Home Management : 8-22 mins  Helene Kelp OTR/L Acute Rehabilitation Services Office: [phone]   Wyn Forster 12/28/2019, 4:08 PM

## 2019-12-28 NOTE — TOC Progression Note (Signed)
Transition of Care [facility]) - Progression Note    Patient Details  Name: Mathew Pierce MRN: [id number] Date of Birth: [birth year]  Transition of Care [facility]) CM/SW [place], [place] Phone Number:[phone] 12/28/2019, 1:46 PM  Clinical Narrative:      CSW met with patient bedside. CSW introduced self and explained role. CSW competed SBIRT with patient. Patient denied alcohol use and other substance use. No other interventions are needed at this time.       Expected Discharge Plan and Services                                                 Social Determinants of Health (SDOH) Interventions    Readmission Risk Interventions No flowsheet data found.  Emeterio Reeve, Latanya Presser, [place] Licensed Holiday representative

## 2019-12-28 NOTE — Consult Note (Signed)
Consultation: Scrotal laceration, concern for ruptured testicle on imaging Requested by: Dr. Reather Laurence  History of Present Illness: [name] is a 74 year old male who was fishing earlier this evening when he reports he stumbled backwards and fell over a wagon that had 4 poles extending on each corner.  That somehow struck him in the right hemiscrotum but he felt the force of the blow up in the right abdomen.  CT scan showed right groin hematoma and comment of "the right testicle is poorly identified". The left tesitcle is not commented on but it is also poorly identified on CT, but further inspection of the right testicle is visible adjacent to the left testicle in the dependent hemiscrotum. One aid in finding the testicles on CT is the gonadal artery on each side and the cord is easy to follow down to the testicles.  There was no extravasation of the gonadal artery or testicular vessels on the CT. The patient sustained a right scrotal laceration to the right lateral inferior scrotum.   Past Medical History:  Diagnosis Date  . Diabetes mellitus without complication ([place])   . Dialysis patient ([place])   . Hypertension   . Renal disorder    History reviewed. No pertinent surgical history.  Home Medications:  (Not in a hospital admission)  Allergies: No Known Allergies  No family history on file. Social History:  reports that he has never smoked. He has never used smokeless tobacco. He reports that he does not drink alcohol or use drugs.  ROS: A complete review of systems was performed.  All systems are negative except for pertinent findings as noted. Review of Systems  Gastrointestinal: Positive for abdominal pain.  All other systems reviewed and are negative.    Physical Exam:  Vital signs in last 24 hours: Temp:  [97.6 F (36.4 C)-98 F (36.7 C)] 98 F (36.7 C) (03/21 2255) Pulse Rate:  [35-76] 61 (03/21 2315) Resp:  [14-18] 14 (03/21 2315) BP: (132-181)/(62-105) 150/75 (03/21  2315) SpO2:  [95 %-100 %] 95 % (03/21 2315) Weight:  [79.8 kg] 79.8 kg (03/21 1821) General:  Alert and oriented, No acute distress HEENT: Normocephalic, atraumatic  Cardiovascular: Regular rate and rhythm Lungs: Regular rate and effort Abdomen: Soft, nontender, nondistended, no abdominal masses Back: No CVA tenderness Extremities: No edema Neurologic: Grossly intact GU: penis circumcised and without mass or lesion. Glans and meatus appeared normal. Right scrotal lac lateral and inferior, boomerang shaped, about 6 cm.  Hemostatic.  Appears to be more of a blunt force mashing, shearing type injury that peeled the skin back.  Nonpenetrating.  Right groin and scrotum tender to palpation, but right testicle palpably normal in the dependent right hemiscrotum.  Left testicle palpably normal as well.  Procedure: I discussed the injury with the patient.  I discussed closure with some interrupted suture to bring the skin together but still allow it to drain some of the edema and any blood if necessary.  The wound was triple sprayed and rinsed with no rinse cleansing spray and lightly debrided with 4 x 4's.  This created some superficial bleeding. The wound bed was wet from irrigation.  Lidocaine with epi was used to infiltrate the skin edges in several places.  The largest chromic in the suture room was 4-0 and I used it to place interrupted sutures to bring the wound back together.  A picture was posted once the most inferior edge was closed.  I then came up and around further interrupted sutures  which nicely brought the flap down and reapproximated the skin. I had to lift the scrotum to gain access to the inferior border of the lac which gave me a thorough exam of the testicles each time. He was tender but not overly tender for me to be able to examine and elevate the scrotum for the closure. The skin looked healthy and viable and I didn't feel it needed any debridement. Again, this looked more like a pinch  and shearing of the skin rather than a penetrating force.   Laboratory Data:  Results for orders placed or performed during the hospital encounter of 12/27/19 (from the past 24 hour(s))  CBC     Status: Abnormal   Collection Time: 12/27/19  7:00 PM  Result Value Ref Range   WBC 7.2 4.0 - 10.5 K/uL   RBC 4.27 4.22 - 5.81 MIL/uL   Hemoglobin 13.9 13.0 - 17.0 g/dL   HCT 43.4 39.0 - 52.0 %   MCV 101.6 (H) 80.0 - 100.0 fL   MCH 32.6 26.0 - 34.0 pg   MCHC 32.0 30.0 - 36.0 g/dL   RDW 16.7 (H) 11.5 - 15.5 %   Platelets 183 150 - 400 K/uL   nRBC 0.0 0.0 - 0.2 %  Basic metabolic panel     Status: Abnormal   Collection Time: 12/27/19  7:00 PM  Result Value Ref Range   Sodium 138 135 - 145 mmol/L   Potassium 4.8 3.5 - 5.1 mmol/L   Chloride 101 98 - 111 mmol/L   CO2 24 22 - 32 mmol/L   Glucose, Bld 153 (H) 70 - 99 mg/dL   BUN 35 (H) 8 - 23 mg/dL   Creatinine, Ser 8.39 (H) 0.61 - 1.24 mg/dL   Calcium 8.4 (L) 8.9 - 10.3 mg/dL   GFR calc non Af Amer 6 (L) >60 mL/min   GFR calc Af Amer 7 (L) >60 mL/min   Anion gap 13 5 - 15  CBG monitoring, ED     Status: Abnormal   Collection Time: 12/27/19  9:08 PM  Result Value Ref Range   Glucose-Capillary 106 (H) 70 - 99 mg/dL  Respiratory Panel by RT PCR (Flu A&B, Covid) - Nasopharyngeal Swab     Status: None   Collection Time: 12/27/19  9:53 PM   Specimen: Nasopharyngeal Swab  Result Value Ref Range   SARS Coronavirus 2 by RT PCR NEGATIVE NEGATIVE   Influenza A by PCR NEGATIVE NEGATIVE   Influenza B by PCR NEGATIVE NEGATIVE   Recent Results (from the past 240 hour(s))  Respiratory Panel by RT PCR (Flu A&B, Covid) - Nasopharyngeal Swab     Status: None   Collection Time: 12/27/19  9:53 PM   Specimen: Nasopharyngeal Swab  Result Value Ref Range Status   SARS Coronavirus 2 by RT PCR NEGATIVE NEGATIVE Final    Comment: (NOTE) SARS-CoV-2 target nucleic acids are NOT DETECTED. The SARS-CoV-2 RNA is generally detectable in upper  respiratoy specimens during the acute phase of infection. The lowest concentration of SARS-CoV-2 viral copies this assay can detect is 131 copies/mL. A negative result does not preclude SARS-Cov-2 infection and should not be used as the sole basis for treatment or other patient management decisions. A negative result may occur with  improper specimen collection/handling, submission of specimen other than nasopharyngeal swab, presence of viral mutation(s) within the areas targeted by this assay, and inadequate number of viral copies (<131 copies/mL). A negative result must be combined with clinical observations,  patient history, and epidemiological information. The expected result is Negative. Fact Sheet for Patients:  PinkCheek.be Fact Sheet for Healthcare Providers:  GravelBags.it This test is not yet ap proved or cleared by the [place] FDA and  has been authorized for detection and/or diagnosis of SARS-CoV-2 by FDA under an Emergency Use Authorization (EUA). This EUA will remain  in effect (meaning this test can be used) for the duration of the COVID-19 declaration under Section 564(b)(1) of the Act, 21 U.S.C. section 360bbb-3(b)(1), unless the authorization is terminated or revoked sooner.    Influenza A by PCR NEGATIVE NEGATIVE Final   Influenza B by PCR NEGATIVE NEGATIVE Final    Comment: (NOTE) The Xpert Xpress SARS-CoV-2/FLU/RSV assay is intended as an aid in  the diagnosis of influenza from Nasopharyngeal swab specimens and  should not be used as a sole basis for treatment. Nasal washings and  aspirates are unacceptable for Xpert Xpress SARS-CoV-2/FLU/RSV  testing. Fact Sheet for Patients: PinkCheek.be Fact Sheet for Healthcare Providers: GravelBags.it This test is not yet approved or cleared by the [place] FDA and  has been authorized for  detection and/or diagnosis of SARS-CoV-2 by  FDA under an Emergency Use Authorization (EUA). This EUA will remain  in effect (meaning this test can be used) for the duration of the  Covid-19 declaration under Section 564(b)(1) of the Act, 21  U.S.C. section 360bbb-3(b)(1), unless the authorization is  terminated or revoked. Performed at [facility], [address] [place], [place] [address]    Creatinine: Recent Labs    12/27/19 1900  CREATININE 8.39*    Impression/Assessment/plan:  Groin/scrotal hematoma - I did not appreciate any right testicle injury on CT or exam. Will follow.   Right scrotal laceration - closed with interrupted suture. Routine incision care. Keep scrotal support (mesh underwear or jock strap) with fluffs when ambulating. I would expect the laceration to weep blood and fluid as it is the dependent portion of the injury and some gaps were left in the suture for drainage.   Festus Aloe 12/28/2019, 1:24 AM

## 2019-12-28 NOTE — Progress Notes (Signed)
Subjective: Having a lot of pain in his groin and scrotal area.  Hasn't eaten yet, but hungry.  Doesn't void due to his HD.  Dr. Dimas Aguas is his nephrologist at [facility].  ROS: See above, otherwise other systems negative  Objective: Vital signs in last 24 hours: Temp:  [97.6 F (36.4 C)-98.4 F (36.9 C)] 98.4 F (36.9 C) (03/22 0749) Pulse Rate:  [35-76] 62 (03/22 0749) Resp:  [14-18] 16 (03/22 0749) BP: (132-181)/(62-105) 134/63 (03/22 0749) SpO2:  [95 %-100 %] 96 % (03/22 0749) Weight:  [79.8 kg] 79.8 kg (03/21 1821) Last BM Date: 12/27/19  Intake/Output from previous day: 03/21 0701 - 03/22 0700 In: 175.3 [P.O.:120; I.V.:9.9; IV Piggyback:45.4] Out: -  Intake/Output this shift: No intake/output data recorded.  PE: Gen: NAD HEENT: PERRL Neck: no thyromegaly Heart: regular Lungs: CTAB Abd: soft, NT, ND, +BS GU: normal male penis.  Scrotal laceration is sutures with some serosang drainage.  Right groin with large, at least baseball size hematoma that is very tender to palpation. Ext: MAE Skin: warm and dry Psych: A&Ox3  Lab Results:  Recent Labs    12/27/19 1900 12/28/19 0233  WBC 7.2 10.1  HGB 13.9 12.2*  HCT 43.4 38.9*  PLT 183 179   BMET Recent Labs    12/27/19 1900  NA 138  K 4.8  CL 101  CO2 24  GLUCOSE 153*  BUN 35*  CREATININE 8.39*  CALCIUM 8.4*   PT/INR No results for input(s): LABPROT, INR in the last 72 hours. CMP     Component Value Date/Time   NA 138 12/27/2019 1900   K 4.8 12/27/2019 1900   CL 101 12/27/2019 1900   CO2 24 12/27/2019 1900   GLUCOSE 153 (H) 12/27/2019 1900   BUN 35 (H) 12/27/2019 1900   CREATININE 8.39 (H) 12/27/2019 1900   CALCIUM 8.4 (L) 12/27/2019 1900   GFRNONAA 6 (L) 12/27/2019 1900   GFRAA 7 (L) 12/27/2019 1900   Lipase  No results found for: LIPASE     Studies/Results: CT Abdomen Pelvis W Contrast  Addendum Date: 12/27/2019   ADDENDUM REPORT: 12/27/2019 20:54 ADDENDUM: Results were  discussed with Dr. Arie Sabina at 8:42 p.m. [place] on December 27, 2019. Electronically Signed   By: Virgina Norfolk M.D.   On: 12/27/2019 20:54   Result Date: 12/27/2019 CLINICAL DATA:  Status post trauma. EXAM: CT ABDOMEN AND PELVIS WITH CONTRAST TECHNIQUE: Multidetector CT imaging of the abdomen and pelvis was performed using the standard protocol following bolus administration of intravenous contrast. CONTRAST:  144mL OMNIPAQUE IOHEXOL 300 MG/ML  SOLN COMPARISON:  April 21, 2019 FINDINGS: Lower chest: No acute abnormality. Hepatobiliary: No focal liver abnormality is seen. Gallstones and heterogeneous sludge are seen within the lumen of a contracted gallbladder. There is no evidence of biliary dilatation. Pancreas: Unremarkable. No pancreatic ductal dilatation or surrounding inflammatory changes. Spleen: Normal in size without focal abnormality. Adrenals/Urinary Tract: Adrenal glands are unremarkable. The native kidneys are very small, without renal calculi or hydronephrosis. Multiple bilateral renal cysts are seen. A renal transplant is seen within the right hemipelvis. Diffuse cortical thinning of the transplanted kidney is noted. 1.2 cm and 1.0 cm renal cysts are also seen. Mild diffuse urinary bladder wall thickening is seen. Stomach/Bowel: Stomach is within normal limits. Appendix appears normal. No evidence of bowel wall thickening, distention, or inflammatory changes. Vascular/Lymphatic: There is moderate to marked severity aortic calcification and atherosclerosis. No enlarged abdominal or pelvic lymph nodes. Reproductive:  Prostate is unremarkable. Other: A moderate to marked amount of para muscular soft tissue air is seen within the deep subcutaneous fat along the anterior aspect of the lower pelvic wall on the right. This extends into the right inguinal region and is seen along the superior aspect of the scrotum on the right. 6.4 cm x 4.6 cm and 2.8 cm x 2.2 cm areas of mildly increased attenuation  are also seen within this portion of subcutaneous fat. A 2.3 cm linear area of contrast enhancement is seen anterior to these areas (axial CT images 78 through 80, CT series number 2). The larger area of increased attenuation extends inferiorly along the right groin and superior aspect of the scrotum on the right. A moderate to marked amount of surrounding inflammatory fat stranding is seen. A moderate amount of heterogeneous increased attenuation is noted throughout the scrotum on the right. Diffuse scrotal wall edema is also seen. The right testicle is poorly identified. Musculoskeletal: Moderate severity multilevel changes seen throughout the lumbar spine. IMPRESSION: 1. Moderate to marked amount of lower right anterior pelvic wall, right inguinal and right scrotal soft tissue air with associated hematomas and acute scrotal blood. The 2.3 cm linear area of contrast enhancement seen anterior to the pelvic wall and inguinal hematomas is consistent with a focus of active bleeding. Subsequent rupture of the right testicle cannot be excluded. 2. Cholelithiasis and heterogeneous sludge within a contracted gallbladder. 3. 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 4. Moderate to marked severity aortic calcification and atherosclerosis. 5. Mild diffuse urinary bladder wall thickening. Correlation with urinalysis is recommended to exclude cystitis. Aortic Atherosclerosis (ICD10-I70.0). Electronically Signed: By: Virgina Norfolk M.D. On: 12/27/2019 20:47    Anti-infectives: Anti-infectives (From admission, onward)   Start     Dose/Rate Route Frequency Ordered Stop   12/27/19 1845  ceFAZolin (ANCEF) IVPB 1 g/50 mL premix     1 g 100 mL/hr over 30 Minutes Intravenous  Once 12/27/19 1844 12/27/19 1945       Assessment/Plan Fall onto a pole Right groin/scrotal hematoma - large hematoma that is tender, hgb down slightly, but not significantly, q 8 hr cbc still in  place.  PT/OT orders in place Scrotal laceration - repaired by urology, Dr. Junious Silk ESRD on  HD - patient sees Dr. Dimas Aguas at [facility].  I have spoken to nephrology for HD tomorrow likely as he is a T,TH,S patient.  Follow BMET FEN - renal diet DVT - SCDs, hole chemical ppx secondary to hematoma ID - ancef in ED for laceration, none further Dispo - therapies, follow labs, HD   LOS: 0 days    Henreitta Cea , [facility] Surgery 12/28/2019, 9:12 AM Please see Amion for pager number during day hours 7:00am-4:30pm or 7:00am -11:30am on weekends

## 2019-12-28 NOTE — Consult Note (Signed)
[facility]  INPATIENT CONSULTATION  Reason for Consultation: ESRD comanagement dialysis and related do Requesting Provider: Dr. Grandville Silos  HPI: Mathew Pierce is an 74 y.o. male with ESRD on HD TTS, HTN, DM who is currently admitted after groin trauma.  Nephrology is consulted for comanagement of dialytic related needs during admission.  Fell on a pole and had scrotal laceration.  Admitted after laceration repair.  Will need HD tomorrow.   Says lately has had cramps and nausea at end of HD.  Thinks EDW may need adjustment.  Usually UF 3-3.5L.  No issues with AVF.    PMH: Past Medical History:  Diagnosis Date  . Diabetes mellitus without complication ([place])   . Dialysis patient ([place])   . Hypertension   . Renal disorder    PSH: History reviewed. No pertinent surgical history.   Past Medical History:  Diagnosis Date  . Diabetes mellitus without complication ([place])   . Dialysis patient ([place])   . Hypertension   . Renal disorder     Medications:  I have reviewed the patient's current medications.  Medications Prior to Admission  Medication Sig Dispense Refill  . amLODipine (NORVASC) 10 MG tablet Take 10 mg by mouth daily.    Marland Kitchen aspirin EC 81 MG tablet Take 81 mg by mouth daily.    Marland Kitchen atorvastatin (LIPITOR) 40 MG tablet Take 40 mg by mouth daily at 6 PM.     . carvedilol (COREG) 6.25 MG tablet Take 6.25 mg by mouth 2 (two) times daily.    . CELLCEPT 250 MG capsule Take 250 mg by mouth 2 (two) times daily.    . clopidogrel (PLAVIX) 75 MG tablet Take 75 mg by mouth daily.    . folic acid (FOLVITE) 1 MG tablet Take 1 mg by mouth daily.    . insulin detemir (LEVEMIR) 100 UNIT/ML injection Inject 20 Units into the skin 2 (two) times daily.    . magnesium oxide (MAG-OX) 400 MG tablet Take 400 mg by mouth daily.     . pantoprazole (PROTONIX) 40 MG tablet Take 40 mg by mouth 2 (two) times daily.    . predniSONE (DELTASONE) 5 MG tablet Take 5 mg by mouth daily.    Marland Kitchen PROGRAF  1 MG capsule Take 2 mg by mouth 2 (two) times daily.      ALLERGIES:  No Known Allergies  FAM HX: No family history on file.  Social History:   reports that he has never smoked. He has never used smokeless tobacco. He reports that he does not drink alcohol or use drugs.  ROS: 12 system ROS neg except per HPI  Blood pressure 134/63, pulse 62, temperature 98.4 F (36.9 C), temperature source Oral, resp. rate 16, height 5\' 7"  (1.702 m), weight 79.8 kg, SpO2 96 %. PHYSICAL EXAM: Gen: comfortable in bed  Eyes:  anicteric ENT: MMM Neck: no JVD CV:  RRR, no rub Abd:  Soft, nontender Lungs: clear GU: deferred Extr: no edema, RUE with large tortuous AVF with t/b Neuro:nonfocal   Results for orders placed or performed during the hospital encounter of 12/27/19 (from the past 48 hour(s))  CBC     Status: Abnormal   Collection Time: 12/27/19  7:00 PM  Result Value Ref Range   WBC 7.2 4.0 - 10.5 K/uL   RBC 4.27 4.22 - 5.81 MIL/uL   Hemoglobin 13.9 13.0 - 17.0 g/dL   HCT 43.4 39.0 - 52.0 %   MCV 101.6 (H) 80.0 -  100.0 fL   MCH 32.6 26.0 - 34.0 pg   MCHC 32.0 30.0 - 36.0 g/dL   RDW 16.7 (H) 11.5 - 15.5 %   Platelets 183 150 - 400 K/uL   nRBC 0.0 0.0 - 0.2 %    Comment: Performed at [facility], [address] [place], [place] [address]  Basic metabolic panel     Status: Abnormal   Collection Time: 12/27/19  7:00 PM  Result Value Ref Range   Sodium 138 135 - 145 mmol/L   Potassium 4.8 3.5 - 5.1 mmol/L   Chloride 101 98 - 111 mmol/L   CO2 24 22 - 32 mmol/L   Glucose, Bld 153 (H) 70 - 99 mg/dL    Comment: Glucose reference range applies only to samples taken after fasting for at least 8 hours.   BUN 35 (H) 8 - 23 mg/dL   Creatinine, Ser 8.39 (H) 0.61 - 1.24 mg/dL   Calcium 8.4 (L) 8.9 - 10.3 mg/dL   GFR calc non Af Amer 6 (L) >60 mL/min   GFR calc Af Amer 7 (L) >60 mL/min   Anion gap 13 5 - 15    Comment: Performed at [facility], [address]  [place], [place] [address]  CBG monitoring, ED     Status: Abnormal   Collection Time: 12/27/19  9:08 PM  Result Value Ref Range   Glucose-Capillary 106 (H) 70 - 99 mg/dL    Comment: Glucose reference range applies only to samples taken after fasting for at least 8 hours.  Respiratory Panel by RT PCR (Flu A&B, Covid) - Nasopharyngeal Swab     Status: None   Collection Time: 12/27/19  9:53 PM   Specimen: Nasopharyngeal Swab  Result Value Ref Range   SARS Coronavirus 2 by RT PCR NEGATIVE NEGATIVE    Comment: (NOTE) SARS-CoV-2 target nucleic acids are NOT DETECTED. The SARS-CoV-2 RNA is generally detectable in upper respiratoy specimens during the acute phase of infection. The lowest concentration of SARS-CoV-2 viral copies this assay can detect is 131 copies/mL. A negative result does not preclude SARS-Cov-2 infection and should not be used as the sole basis for treatment or other patient management decisions. A negative result may occur with  improper specimen collection/handling, submission of specimen other than nasopharyngeal swab, presence of viral mutation(s) within the areas targeted by this assay, and inadequate number of viral copies (<131 copies/mL). A negative result must be combined with clinical observations, patient history, and epidemiological information. The expected result is Negative. Fact Sheet for Patients:  PinkCheek.be Fact Sheet for Healthcare Providers:  GravelBags.it This test is not yet ap proved or cleared by the [place] FDA and  has been authorized for detection and/or diagnosis of SARS-CoV-2 by FDA under an Emergency Use Authorization (EUA). This EUA will remain  in effect (meaning this test can be used) for the duration of the COVID-19 declaration under Section 564(b)(1) of the Act, 21 U.S.C. section 360bbb-3(b)(1), unless the authorization is terminated or revoked sooner.    Influenza A by PCR  NEGATIVE NEGATIVE   Influenza B by PCR NEGATIVE NEGATIVE    Comment: (NOTE) The Xpert Xpress SARS-CoV-2/FLU/RSV assay is intended as an aid in  the diagnosis of influenza from Nasopharyngeal swab specimens and  should not be used as a sole basis for treatment. Nasal washings and  aspirates are unacceptable for Xpert Xpress SARS-CoV-2/FLU/RSV  testing. Fact Sheet for Patients: PinkCheek.be Fact Sheet for Healthcare  Providers: GravelBags.it This test is not yet approved or cleared by the [place] and  has been authorized for detection and/or diagnosis of SARS-CoV-2 by  FDA under an Emergency Use Authorization (EUA). This EUA will remain  in effect (meaning this test can be used) for the duration of the  Covid-19 declaration under Section 564(b)(1) of the Act, 21  U.S.C. section 360bbb-3(b)(1), unless the authorization is  terminated or revoked. Performed at [facility], [address] [place], [place] [address]   Glucose, capillary     Status: Abnormal   Collection Time: 12/28/19  1:58 AM  Result Value Ref Range   Glucose-Capillary 125 (H) 70 - 99 mg/dL    Comment: Glucose reference range applies only to samples taken after fasting for at least 8 hours.  CBC     Status: Abnormal   Collection Time: 12/28/19  2:33 AM  Result Value Ref Range   WBC 10.1 4.0 - 10.5 K/uL   RBC 3.87 (L) 4.22 - 5.81 MIL/uL   Hemoglobin 12.2 (L) 13.0 - 17.0 g/dL   HCT 38.9 (L) 39.0 - 52.0 %   MCV 100.5 (H) 80.0 - 100.0 fL   MCH 31.5 26.0 - 34.0 pg   MCHC 31.4 30.0 - 36.0 g/dL   RDW 16.7 (H) 11.5 - 15.5 %   Platelets 179 150 - 400 K/uL   nRBC 0.0 0.0 - 0.2 %    Comment: Performed at [facility], [place] [address] [place], [place] [address]  Glucose, capillary     Status: Abnormal   Collection Time: 12/28/19  7:51 AM  Result Value Ref Range   Glucose-Capillary 104 (H) 70 - 99 mg/dL    Comment: Glucose reference range  applies only to samples taken after fasting for at least 8 hours.  CBC     Status: Abnormal   Collection Time: 12/28/19 11:05 AM  Result Value Ref Range   WBC 6.2 4.0 - 10.5 K/uL   RBC 3.81 (L) 4.22 - 5.81 MIL/uL   Hemoglobin 12.2 (L) 13.0 - 17.0 g/dL   HCT 37.7 (L) 39.0 - 52.0 %   MCV 99.0 80.0 - 100.0 fL   MCH 32.0 26.0 - 34.0 pg   MCHC 32.4 30.0 - 36.0 g/dL   RDW 16.7 (H) 11.5 - 15.5 %   Platelets 141 (L) 150 - 400 K/uL   nRBC 0.0 0.0 - 0.2 %    Comment: Performed at [facility] [address] [place], [place] [address]  Basic metabolic panel     Status: Abnormal   Collection Time: 12/28/19 11:05 AM  Result Value Ref Range   Sodium 134 (L) 135 - 145 mmol/L   Potassium 5.7 (H) 3.5 - 5.1 mmol/L   Chloride 99 98 - 111 mmol/L   CO2 19 (L) 22 - 32 mmol/L   Glucose, Bld 136 (H) 70 - 99 mg/dL    Comment: Glucose reference range applies only to samples taken after fasting for at least 8 hours.   BUN 43 (H) 8 - 23 mg/dL   Creatinine, Ser 9.55 (H) 0.61 - 1.24 mg/dL   Calcium 8.2 (L) 8.9 - 10.3 mg/dL   GFR calc non Af Amer 5 (L) >60 mL/min   GFR calc Af Amer 6 (L) >60 mL/min   Anion gap 16 (H) 5 - 15    Comment: Performed at [facility] [address], [place], [place] [address]  Glucose, capillary  Status: Abnormal   Collection Time: 12/28/19 11:50 AM  Result Value Ref Range   Glucose-Capillary 133 (H) 70 - 99 mg/dL    Comment: Glucose reference range applies only to samples taken after fasting for at least 8 hours.    CT Abdomen Pelvis W Contrast  Addendum Date: 12/27/2019   ADDENDUM REPORT: 12/27/2019 20:54 ADDENDUM: Results were discussed with Dr. Arie Sabina at 8:42 p.m. [place] on December 27, 2019. Electronically Signed   By: Virgina Norfolk M.D.   On: 12/27/2019 20:54   Result Date: 12/27/2019 CLINICAL DATA:  Status post trauma. EXAM: CT ABDOMEN AND PELVIS WITH CONTRAST TECHNIQUE: Multidetector CT imaging of the abdomen and pelvis was performed using the  standard protocol following bolus administration of intravenous contrast. CONTRAST:  178mL OMNIPAQUE IOHEXOL 300 MG/ML  SOLN COMPARISON:  April 21, 2019 FINDINGS: Lower chest: No acute abnormality. Hepatobiliary: No focal liver abnormality is seen. Gallstones and heterogeneous sludge are seen within the lumen of a contracted gallbladder. There is no evidence of biliary dilatation. Pancreas: Unremarkable. No pancreatic ductal dilatation or surrounding inflammatory changes. Spleen: Normal in size without focal abnormality. Adrenals/Urinary Tract: Adrenal glands are unremarkable. The native kidneys are very small, without renal calculi or hydronephrosis. Multiple bilateral renal cysts are seen. A renal transplant is seen within the right hemipelvis. Diffuse cortical thinning of the transplanted kidney is noted. 1.2 cm and 1.0 cm renal cysts are also seen. Mild diffuse urinary bladder wall thickening is seen. Stomach/Bowel: Stomach is within normal limits. Appendix appears normal. No evidence of bowel wall thickening, distention, or inflammatory changes. Vascular/Lymphatic: There is moderate to marked severity aortic calcification and atherosclerosis. No enlarged abdominal or pelvic lymph nodes. Reproductive: Prostate is unremarkable. Other: A moderate to marked amount of para muscular soft tissue air is seen within the deep subcutaneous fat along the anterior aspect of the lower pelvic wall on the right. This extends into the right inguinal region and is seen along the superior aspect of the scrotum on the right. 6.4 cm x 4.6 cm and 2.8 cm x 2.2 cm areas of mildly increased attenuation are also seen within this portion of subcutaneous fat. A 2.3 cm linear area of contrast enhancement is seen anterior to these areas (axial CT images 78 through 80, CT series number 2). The larger area of increased attenuation extends inferiorly along the right groin and superior aspect of the scrotum on the right. A moderate to marked  amount of surrounding inflammatory fat stranding is seen. A moderate amount of heterogeneous increased attenuation is noted throughout the scrotum on the right. Diffuse scrotal wall edema is also seen. The right testicle is poorly identified. Musculoskeletal: Moderate severity multilevel changes seen throughout the lumbar spine. IMPRESSION: 1. Moderate to marked amount of lower right anterior pelvic wall, right inguinal and right scrotal soft tissue air with associated hematomas and acute scrotal blood. The 2.3 cm linear area of contrast enhancement seen anterior to the pelvic wall and inguinal hematomas is consistent with a focus of active bleeding. Subsequent rupture of the right testicle cannot be excluded. 2. Cholelithiasis and heterogeneous sludge within a contracted gallbladder. 3. 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 4. Moderate to marked severity aortic calcification and atherosclerosis. 5. Mild diffuse urinary bladder wall thickening. Correlation with urinalysis is recommended to exclude cystitis. Aortic Atherosclerosis (ICD10-I70.0). Electronically Signed: By: Virgina Norfolk M.D. On: 12/27/2019 20:47   Dialysis orders:  pending  Assessment/Plan **scrotal trauma: per urology/trauma svc  **  ESRD on HD:  Plan HD tomorrow 4 hrs via AVF.  Will obtain outpt orders.  Plan 2K/2.5Ca, BFR 400, 15g.  No heparin in light of trauma.  May need EDW change/ follow daily weights.   **Hyperkalemia:  lokelma 10g BID today and HD in AM.   **BMM:  Corr ca ok, check phos if remains admitted.  **Anemia: Hb > 12, no ESA.   Justin Mend 12/28/2019, 1:02 PM

## 2019-12-28 NOTE — Progress Notes (Signed)
OT Cancellation Note  Patient Details Name: Mathew Pierce MRN: [id number] DOB: 1946/06/09   Cancelled Treatment:    Reason Eval/Treat Not Completed: Patient declined, no reason specified Pt declined therapy at this time, requesting to sleep and for therapy to return later. OT will return later as time allows and pt is appropriate.   [facility] OTR/L Acute Rehabilitation Services Office: [place] 12/28/2019, 1:57 PM

## 2019-12-29 ENCOUNTER — Encounter (HOSPITAL_COMMUNITY): Payer: Self-pay

## 2019-12-29 LAB — BASIC METABOLIC PANEL
Anion gap: 13 (ref 5–15)
BUN: 52 mg/dL — ABNORMAL HIGH (ref 8–23)
CO2: 23 mmol/L (ref 22–32)
Calcium: 7.8 mg/dL — ABNORMAL LOW (ref 8.9–10.3)
Chloride: 99 mmol/L (ref 98–111)
Creatinine, Ser: 11.21 mg/dL — ABNORMAL HIGH (ref 0.61–1.24)
GFR calc Af Amer: 5 mL/min — ABNORMAL LOW (ref >60)
GFR calc non Af Amer: 4 mL/min — ABNORMAL LOW (ref >60)
Glucose, Bld: 94 mg/dL (ref 70–99)
Potassium: 5.3 mmol/L — ABNORMAL HIGH (ref 3.5–5.1)
Sodium: 135 mmol/L (ref 135–145)

## 2019-12-29 LAB — HEPATITIS B SURFACE ANTIGEN: Hepatitis B Surface Ag: NONREACTIVE

## 2019-12-29 LAB — CBC
HCT: 31.1 % — ABNORMAL LOW (ref 39.0–52.0)
Hemoglobin: 10.1 g/dL — ABNORMAL LOW (ref 13.0–17.0)
MCH: 32.2 pg (ref 26.0–34.0)
MCHC: 32.5 g/dL (ref 30.0–36.0)
MCV: 99 fL (ref 80.0–100.0)
Platelets: 127 10*3/uL — ABNORMAL LOW (ref 150–400)
RBC: 3.14 MIL/uL — ABNORMAL LOW (ref 4.22–5.81)
RDW: 16.3 % — ABNORMAL HIGH (ref 11.5–15.5)
WBC: 5.3 10*3/uL (ref 4.0–10.5)
nRBC: 0 % (ref 0.0–0.2)

## 2019-12-29 LAB — GLUCOSE, CAPILLARY
Glucose-Capillary: 89 mg/dL (ref 70–99)
Glucose-Capillary: 79 mg/dL (ref 70–99)
Glucose-Capillary: 157 mg/dL — ABNORMAL HIGH (ref 70–99)
Glucose-Capillary: 167 mg/dL — ABNORMAL HIGH (ref 70–99)

## 2019-12-29 MED ORDER — ACETAMINOPHEN 500 MG PO TABS: 1000.0000 mg | ORAL_TABLET | Freq: Four times a day (QID) | ORAL | 0 refills | Status: DC | PRN

## 2019-12-29 MED ORDER — LIDOCAINE-PRILOCAINE 2.5-2.5 % EX CREA: 1.0000 "application " | TOPICAL_CREAM | CUTANEOUS | Status: DC | PRN

## 2019-12-29 MED ORDER — SODIUM CHLORIDE 0.9 % IV SOLN: 100.0000 mL | INTRAVENOUS | Status: DC | PRN

## 2019-12-29 MED ORDER — ALTEPLASE 2 MG IJ SOLR: 2.0000 mg | Freq: Once | INTRAMUSCULAR | Status: DC | PRN

## 2019-12-29 MED ORDER — HEPARIN SODIUM (PORCINE) 1000 UNIT/ML DIALYSIS: 1000.0000 [IU] | INTRAMUSCULAR | Status: DC | PRN

## 2019-12-29 MED ORDER — LIDOCAINE HCL (PF) 1 % IJ SOLN: 5.0000 mL | INTRAMUSCULAR | Status: DC | PRN

## 2019-12-29 MED ORDER — TRAMADOL HCL 50 MG PO TABS: 50.0000 mg | ORAL_TABLET | Freq: Four times a day (QID) | ORAL | 0 refills | Status: AC | PRN

## 2019-12-29 MED ORDER — PENTAFLUOROPROP-TETRAFLUOROETH EX AERO: 1.0000 "application " | INHALATION_SPRAY | CUTANEOUS | Status: DC | PRN

## 2019-12-29 MED ORDER — MORPHINE SULFATE (PF) 4 MG/ML IV SOLN
INTRAVENOUS | Status: AC
  Administered 2019-12-29: 4 mg via INTRAVENOUS
  Filled 2019-12-29: qty 1

## 2019-12-29 NOTE — Plan of Care (Signed)

## 2019-12-29 NOTE — Progress Notes (Signed)
Renal Navigator notes plan for discharge today and called [place] to inform. Discharge records faxed to OP HD clinic to provide continuity of care.  Alphonzo Cruise, [place] Renal Navigator [phone]

## 2019-12-29 NOTE — Progress Notes (Signed)
Call from [facility], concern for discharge status of patient. Per Day shift Nurse Angelito, during bedside report at shift change, no report of discharge orders noted.   Reviewed orders, noted discharge order written at 0836 am. Patient stated " I was told I was going home yesterday, the nurse today didn't say anything to me about going home".   Spoke with patient to see if he had a ride home. Patient called daughter Gwenyth Bender. Per daughter, no one available to pick him up tonight. However, someone can pick him up at 10 am on 12/30/2019.   Updated K. Maxwell Caul PA and charge nurse Lauren made aware.

## 2019-12-29 NOTE — Discharge Summary (Signed)
Patient ID: Mathew Pierce [id number] 02/05/1946 74 y.o.  Admit date: 12/27/2019 Discharge date: 12/29/2019  Admitting Diagnosis: Fall Right inguinal hematoma Right scrotal laceration ESRD on HD  Discharge Diagnosis Patient Active Problem List   Diagnosis Date Noted  . Hematoma 12/28/2019  Fall Right inguinal hematoma Right scrotal laceration ESRD on HD  Consultants Dr. Johnney Ou with nephrology Dr. Junious Silk with urology  Reason for Admission: 4M s/p mechanical GLF while out fishing this evening. Reports tripping over his fishing rods and falling onto a wagon that had poles extending upward at each corner. Denies hitting/injuring anything other than his scrotum/groin. Denies LOC, no CP/SOB/dizziness prior to fall. Dialysis patient, RUE AVF, TTS, last complete session 3/20.  Procedures Laceration repair of scrotum by Dr. Elmyra Ricks Course:  The patient was admitted for observation and pain control.  His plavix was held.  He had a moderate sized right inguinal hematoma.  His hgb dropped slightly, but appropriately after hydration and blood loss.  He mobilized well.  His scrotal laceration was repaired and remained stable.  He underwent HD on day of discharge which is his normal HD day.  After this he was felt stable for DC home.  He will follow up for suture removal and with his PCP and primary nephrologist for further care.  Physical Exam: Gen: NAD HEENT: mask on, PERRL Heart: regular Lungs: CTAB Abd: soft, NT, ND, +BS GU: right inguinal hematoma, stable, tender as expected.  Scrotal laceration stable with appropriate SS drainage Ext: MAE Psych: A&Ox3  Allergies as of 12/29/2019   No Known Allergies     Medication List    STOP taking these medications   clopidogrel 75 MG tablet Commonly known as: PLAVIX     TAKE these medications   acetaminophen 500 MG tablet Commonly known as: TYLENOL Take 2 tablets (1,000 mg total) by mouth every 6 (six) hours as  needed.   amLODipine 10 MG tablet Commonly known as: NORVASC Take 10 mg by mouth daily.   aspirin EC 81 MG tablet Take 81 mg by mouth daily.   atorvastatin 40 MG tablet Commonly known as: LIPITOR Take 40 mg by mouth daily at 6 PM.   carvedilol 6.25 MG tablet Commonly known as: COREG Take 6.25 mg by mouth 2 (two) times daily.   CellCept 250 MG capsule Generic drug: mycophenolate Take 250 mg by mouth 2 (two) times daily.   folic acid 1 MG tablet Commonly known as: FOLVITE Take 1 mg by mouth daily.   insulin detemir 100 UNIT/ML injection Commonly known as: LEVEMIR Inject 20 Units into the skin 2 (two) times daily.   magnesium oxide 400 MG tablet Commonly known as: MAG-OX Take 400 mg by mouth daily.   pantoprazole 40 MG tablet Commonly known as: PROTONIX Take 40 mg by mouth 2 (two) times daily.   predniSONE 5 MG tablet Commonly known as: DELTASONE Take 5 mg by mouth daily.   Prograf 1 MG capsule Generic drug: tacrolimus Take 2 mg by mouth 2 (two) times daily.   traMADol 50 MG tablet Commonly known as: Ultram Take 1 tablet (50 mg total) by mouth every 6 (six) hours as needed.        Follow-up Information    Iva Lento, PA-C Follow up.   Specialty: Internal Medicine Why: as needed for hematoma Contact information: [place] [address] [phone]        Festus Aloe, MD Follow up in 10 day(s).  Specialty: Urology Why: call and make an appointment for 10 days from now to get your sutures out of your scrotum Contact information: [place] [address] [phone]           Signed: Saverio Danker, [facility] 12/29/2019, 8:38 AM Please see Amion for pager number during day hours 7:00am-4:30pm, 7-11:30am on Weekends

## 2019-12-29 NOTE — Care Management Obs Status (Signed)
[place] NOTIFICATION   Patient Details  Name: Mathew Pierce MRN: [id number] Date of Birth: 1946/01/18   Medicare Observation Status Notification Given:  Yes    Sharin Mons, RN 12/29/2019, 12:58 PM

## 2019-12-29 NOTE — Progress Notes (Signed)
Occupational Therapy Treatment Patient Details Name: GRAESYN SCHREIFELS MRN: [id number] DOB: 06-24-1946 Today's Date: 12/29/2019    History of present illness Mr. Taddei is a 74 year old male who presented to ED s/p fall resulting in groin/scrotal hematoma and right scrotal laceration closed with interrupted suture.    OT comments  Pt limited this session due to elevated BP, BP 160/129 (taken on upper arm);BP 180/114 (taken on forearm) while sitting EOB. Pt returned to supine and RN notified. Pt ordered lunch, discussed appropriate foods for renal diet and importance of following diet. Educated pt on fall prevention strategies. Will continue to follow and progress pt as tolerated. Pt may d/c this date, will attempt follow-up to address tub transfers prior to pt discharging.    Follow Up Recommendations  No OT follow up;Supervision - Intermittent    Equipment Recommendations  None recommended by OT    Recommendations for Other Services      Precautions / Restrictions Precautions Precautions: Fall Required Braces or Orthoses: Other Brace Other Brace: Keep scrotal support (mesh underwear or jock strap) with fluffs when ambulating. - per physician note Restrictions Weight Bearing Restrictions: No       Mobility Bed Mobility Overal bed mobility: Modified Independent             General bed mobility comments: progressed to EOB with increased time and effort  Transfers                      Balance                                           ADL either performed or assessed with clinical judgement   ADL Overall ADL's : Needs assistance/impaired                     Lower Body Dressing: Supervision/safety;Sitting/lateral leans             Tub/Shower Transfer Details (indicate cue type and reason): verbally reviewed proper tub transfer   General ADL Comments: pt limited to EOB this session due to elevated BP;     Vision        Perception     Praxis      Cognition Arousal/Alertness: Awake/alert Behavior During Therapy: WFL for tasks assessed/performed Overall Cognitive Status: Within Functional Limits for tasks assessed                                          Exercises Exercises: Other exercises Other Exercises Other Exercises: educated pt on fall prevention strategies in the home, provided him with handout Other Exercises: educated pt on renal diet and importance of adherence to renal diet   Shoulder Instructions       General Comments session limited due to elevated BP    Pertinent Vitals/ Pain       Pain Assessment: 0-10 Pain Score: 5  Pain Location: scrotum Pain Descriptors / Indicators: Aching;Jabbing;Operative site guarding Pain Intervention(s): Limited activity within patient's tolerance;Monitored during session  Home Living Family/patient expects to be discharged to:: Private residence Living Arrangements: Spouse/significant other;Children Available Help at Discharge: Family;Available 24 hours/day Type of Home: Apartment Home Access: Level entry     Home Layout: One level     Bathroom Shower/Tub: Tub/shower  unit   Bathroom Toilet: Standard     Home Equipment: Cane - single point          Prior Functioning/Environment Level of Independence: Independent        Comments: pt was independent with ADL, family was assisting with driving;pt enjoys fishing and 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[place], Teacher, English as a foreign language, and Environmental health practitioner)   Frequency  Min 2X/week        Progress Toward Goals  OT Goals(current goals can now be found in the care plan section)  Progress towards OT goals: Progressing toward goals  Acute Rehab OT Goals Patient Stated Goal: to go home OT Goal Formulation: With patient Time For Goal Achievement: 01/11/20 Potential to Achieve Goals: Good ADL Goals Pt Will Perform Lower Body Dressing: sit to/from stand;with modified independence Pt Will Perform  Tub/Shower Transfer: with modified independence;ambulating Additional ADL Goal #1: Pt will demonstrate independence with 3 fall prevention strategies during ADL completion and functional mobiltiy.  Plan Discharge plan remains appropriate    Co-evaluation                 AM-PAC OT "6 Clicks" Daily Activity     Outcome Measure   Help from another person eating meals?: None Help from another person taking care of personal grooming?: None Help from another person toileting, which includes using toliet, bedpan, or urinal?: None Help from another person bathing (including washing, rinsing, drying)?: None Help from another person to put on and taking off regular upper body clothing?: None Help from another person to put on and taking off regular lower body clothing?: None 6 Click Score: 24    End of Session    OT Visit Diagnosis: Unsteadiness on feet (R26.81);Other abnormalities of gait and mobility (R26.89);History of falling (Z91.81)   Activity Tolerance Patient tolerated treatment well   Patient Left in bed;with call bell/phone within reach;with bed alarm set   Nurse Communication Mobility status        Time: 9233-0076 OT Time Calculation (min): 21 min  Charges: OT General Charges $OT Visit: 1 Visit OT Treatments $Self Care/Home Management : 8-22 mins  Helene Kelp OTR/L Acute Rehabilitation Services Office: [place] 12/29/2019, 3:19 PM

## 2019-12-29 NOTE — Progress Notes (Addendum)
Physical Therapy Evaluation Patient Details Name: Mathew Pierce MRN: [id number] DOB: 02-Dec-1945 Today's Date: 12/29/2019   History of Present Illness  Mathew Pierce is a 74 year old male who presented to ED s/p fall resulting in groin/scrotal hematoma and right scrotal laceration closed with interrupted suture.   Clinical Impression  PT evaluation limited by patient refusing bed mobility & functional mobility today secondary to pain and BP concerns.  Pt resting BP was a limiting factor at 169/88 in supine at L FA. Functional ability gained from conversation with OT prior to evaluation and medical chart review. Pt required supervision with no LOB with SPC (with intermittent use), with wide based gait and reduced stride length.  Pt fully independent prior to admission and does not require RW at this time.  Pt demonstrates some unsteadiness with gait/activity and could benefit from further balance training to reduce future fall risk. Discussed with the patient optimal strategies for progression of balance following discharge from hospital. Constant usage of cane for OOB activities, HHPT follow up in 4-6 weeks after tissue healing to improved upright and dynamic balance.  Pt verbalized understanding to initial balance education and goals of PT. Plan to continue following patient while admitted to address functional mobility deficits listed below. Pt requested a note outside his room to attempt to limit interruptions for the next couple of hours.       Follow Up Recommendations Follow surgeon's recommendation for DC plan and follow-up therapies;Other (comment)(Would recommend HHPT 4-6 weeks post op to improve balance)    Equipment Recommendations  None recommended by PT    Recommendations for Other Services       Precautions / Restrictions Precautions Precautions: Fall Required Braces or Orthoses: Other Brace Other Brace: Keep scrotal support (mesh underwear or jock strap) with fluffs when ambulating.  - per physician note Restrictions Weight Bearing Restrictions: No        Pertinent Vitals/Pain Pain Assessment: 0-10 Pain Score: 7  Pain Location: scrotum Pain Descriptors / Indicators: Aching;Jabbing;Operative site guarding Pain Intervention(s): Limited activity within patient's tolerance;Repositioned    12/29/19 1425  Vitals  BP (!) 169/88  MAP (mmHg) 111  BP Location Left Wrist  BP Method Automatic  Patient Position (if appropriate) Lying  Pulse Rate 83  Pulse Rate Source Monitor     Home Living Family/patient expects to be discharged to:: Private residence Living Arrangements: Spouse/significant other;Children Available Help at Discharge: Family;Available 24 hours/day Type of Home: Apartment Home Access: Level entry     Home Layout: One level Home Equipment: Cane - single point      Prior Function Level of Independence: Independent         Comments: pt was independent with ADL, family was assisting with driving;pt enjoys fishing and 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[place], Teacher, English as a foreign language, and Environmental health practitioner)     Journalist, newspaper   Dominant Hand: Right    Extremity/Trunk Assessment   Upper Extremity Assessment Upper Extremity Assessment: Defer to OT evaluation    Lower Extremity Assessment Lower Extremity Assessment: Overall WFL for tasks assessed    Cervical / Trunk Assessment Cervical / Trunk Assessment: Normal  Communication   Communication: No difficulties  Cognition Arousal/Alertness: Awake/alert Behavior During Therapy: WFL for tasks assessed/performed Overall Cognitive Status: Within Functional Limits for tasks assessed        General Comments General comments (skin integrity, edema, etc.): 169/88 (111) HR83 supine    Exercises Other Exercises Other Exercises: Education on static and dynamic balance exercises   Assessment/Plan    PT Assessment  Patient needs continued PT services  PT Problem List Decreased balance;Decreased strength;Decreased activity  tolerance;Decreased knowledge of use of DME;Decreased safety awareness       PT Treatment Interventions DME instruction;Functional mobility training;Therapeutic activities;Therapeutic exercise;Balance training;Neuromuscular re-education;Gait training    PT Goals (Current goals can be found in the Care Plan section)  Acute Rehab PT Goals Patient Stated Goal: to go home PT Goal Formulation: With patient Time For Goal Achievement: 01/12/20 Potential to Achieve Goals: Good    Frequency Min 3X/week    AM-PAC PT "6 Clicks" Mobility  Outcome Measure Help needed turning from your back to your side while in a flat bed without using bedrails?: A Little Help needed moving from lying on your back to sitting on the side of a flat bed without using bedrails?: A Little Help needed moving to and from a bed to a chair (including a wheelchair)?: A Little Help needed standing up from a chair using your arms (e.g., wheelchair or bedside chair)?: A Little Help needed to walk in hospital room?: A Little Help needed climbing 3-5 steps with a railing? : A Little 6 Click Score: 18    End of Session   Activity Tolerance: Patient limited by pain;Patient limited by fatigue;Treatment limited secondary to medical complications (Comment)(BP) Patient left: in bed;with bed alarm set;with call bell/phone within reach   PT Visit Diagnosis: Difficulty in walking, not elsewhere classified (R26.2);Muscle weakness (generalized) (M62.81);Unsteadiness on feet (R26.81);History of falling (Z91.81);Pain    Time: 1007-1219 PT Time Calculation (min) (ACUTE ONLY): 25 min   Charges:   PT Evaluation $PT Eval Low Complexity: 1 Low          Ann Held PT, DPT Acute Rehab [facility] P: [phone]   Vedha Tercero A Vinson Tietze 12/29/2019, 3:01 PM

## 2019-12-29 NOTE — Discharge Instructions (Signed)
HOLD PLAVIX FOR 1 WEEK BEFORE RESUMING  Hematoma A hematoma is a collection of blood. A hematoma can happen:  Under the skin.  In an organ.  In a body space.  In a joint space.  In other tissues. The blood can thicken (clot) to form a lump that you can see and feel. The lump is often hard and may become sore and tender. The lump can be very small or very big. Most hematomas get better in a few days to weeks. However, some hematomas may be serious and need medical care. What are the causes? This condition is caused by:  An injury.  Blood that leaks under the skin.  Problems from surgeries.  Medical conditions that cause bleeding or bruising. What increases the risk? You are more likely to develop this condition if:  You are an older adult.  You use medicines that thin your blood. What are the signs or symptoms? Symptoms depend on where the hematoma is in your body.  If the hematoma is under the skin, there is: ? A firm lump on the body. ? Pain and tenderness in the area. ? Bruising. The skin above the lump may be blue, dark blue, purple-red, or yellowish.  If the hematoma is deep in the tissues or body spaces, there may be: ? Blood in the stomach. This may cause pain in the belly (abdomen), weakness, passing out (fainting), and shortness of breath. ? Blood in the head. This may cause a headache, weakness, trouble speaking or understanding speech, or passing out. How is this diagnosed? This condition is diagnosed based on:  Your medical history.  A physical exam.  Imaging tests, such as ultrasound or CT scan.  Blood tests. How is this treated? Treatment depends on the cause, size, and location of the hematoma. Treatment may include:  Doing nothing. Many hematomas go away on their own without treatment.  Surgery or close monitoring. This may be needed for large hematomas or hematomas that affect the body's organs.  Medicines. These may be given if a medical  condition caused the hematoma. Follow these instructions at home: Managing pain, stiffness, and swelling   If told, put ice on the area. ? Put ice in a plastic bag. ? Place a towel between your skin and the bag. ? Leave the ice on for 20 minutes, 2-3 times a day for the first two days.  If told, put heat on the affected area after putting ice on the area for two days. Use the heat source that your doctor tells you to use. This could be a moist heat pack or a heating pad. To do this: ? Place a towel between your skin and the heat source. ? Leave the heat on for 20-30 minutes. ? Remove the heat if your skin turns bright red. This is very important if you are unable to feel pain, heat, or cold. You may have a greater risk of getting burned.  Raise (elevate) the affected area above the level of your heart while you are sitting or lying down.  Wrap the affected area with an elastic bandage, if told by your doctor. Do not wrap the bandage too tightly.  If your hematoma is on a leg or foot and is painful, your doctor may give you crutches. Use them as told by your doctor. General instructions  Take over-the-counter and prescription medicines only as told by your doctor.  Keep all follow-up visits as told by your doctor. This is important. Contact  a doctor if:  You have a fever.  The swelling or bruising gets worse.  You start to get more hematomas. Get help right away if:  Your pain gets worse.  Your pain is not getting better with medicine.  Your skin over the hematoma breaks or starts to bleed.  Your hematoma is in your chest or belly and you: ? Pass out. ? Feel weak. ? Become short of breath.  You have a hematoma on your scalp that is caused by a fall or injury, and you: ? Have a headache that gets worse. ? Have trouble speaking or understanding speech. ? Become less alert or you pass out. Summary  A hematoma is a collection of blood in any part of your body.  Most  hematomas get better on their own in a few days to weeks. Some may need medical care.  Follow instructions from your doctor about how to care for your hematoma.  Contact a doctor if the swelling or bruising gets worse, or if you are short of breath. This information is not intended to replace advice given to you by your health care provider. Make sure you discuss any questions you have with your health care provider. Document Revised: 02/27/2018 Document Reviewed: 02/27/2018 Elsevier Patient Education  2020 [employer].

## 2019-12-29 NOTE — Procedures (Signed)
Patient was seen on dialysis and the procedure was supervised.  BFR 400  Via R AVF BP is  138/72.   Patient appears to be tolerating treatment well.   K 5.3 this AM will correct. Hb 10.1  Plan for discharge today --> no changes to dialysis related medications or treatment.  Resume outpatient dialysis schedule and orders on discharge.   Justin Mend 12/29/2019

## 2019-12-29 NOTE — Plan of Care (Signed)
  Problem: Activity: Goal: Risk for activity intolerance will decrease Outcome: Progressing   Problem: Coping: Goal: Level of anxiety will decrease Outcome: Progressing   Problem: Pain Managment: Goal: General experience of comfort will improve Outcome: Progressing   Problem: Safety: Goal: Ability to remain free from injury will improve Outcome: Progressing   

## 2019-12-29 NOTE — Care Management CC44 (Signed)
Condition Code 44 Documentation Completed  Patient Details  Name: ADLEY MAZUROWSKI MRN: [id number] Date of Birth: 08/18/1946   Condition Code 44 given:  Yes Patient signature on Condition Code 44 notice:  Yes Documentation of 2 MD's agreement:  Yes Code 44 added to claim:  Yes    Sharin Mons, RN 12/29/2019, 12:58 PM

## 2019-12-29 NOTE — Plan of Care (Signed)

## 2019-12-30 LAB — GLUCOSE, CAPILLARY: Glucose-Capillary: 112 mg/dL — ABNORMAL HIGH (ref 70–99)

## 2019-12-30 LAB — HEPATITIS B SURFACE ANTIBODY, QUANTITATIVE: Hep B S AB Quant (Post): 26.3 m[IU]/mL (ref >9.9)

## 2019-12-30 NOTE — Progress Notes (Signed)
Occupational Therapy Treatment Patient Details Name: Mathew Pierce MRN: [id number] DOB: [birth year] Today's Date: 12/30/2019    History of present illness Mathew Pierce is a 74 year old male who presented to ED s/p fall resulting in groin/scrotal hematoma and right scrotal laceration closed with interrupted suture.    OT comments  Pt making good progress with functional goals ans will d/c home today. Pt waling to bathroom upon arrival. Pt preparing to perform ADLs/selfcare in bathroom at sink. Pt able to gather toiletry items and  and linens Mod I. Pt completed bathing and dressing Mod I. Reviewed fall prevention and home safety with pt; handout provided.  Follow Up Recommendations  No OT follow up;Supervision - Intermittent    Equipment Recommendations  None recommended by OT    Recommendations for Other Services       Precautions / Restrictions Precautions Precautions: Fall Required Braces or Orthoses: Other Brace Other Brace: Keep scrotal support (mesh underwear or jock strap) with fluffs when ambulating. - per physician note Restrictions Weight Bearing Restrictions: No       Mobility Bed Mobility               General bed mobility comments: pt walking to bathroom upon arrival  Transfers Overall transfer level: Modified independent Equipment used: None Transfers: Sit to/from Stand Sit to Stand: Modified independent (Device/Increase time)              Balance Overall balance assessment: Mild deficits observed, not formally tested                                         ADL either performed or assessed with clinical judgement   ADL Overall ADL's : Needs assistance/impaired Eating/Feeding: Independent   Grooming: Standing;Set up;Modified independent   Upper Body Bathing: Set up;Modified independent;Standing   Lower Body Bathing: Set up;Modified independent;Sit to/from stand   Upper Body Dressing : Modified independent   Lower Body  Dressing: Modified independent;Sit to/from stand   Toilet Transfer: [place] and Hygiene: Modified independent;Sit to/from stand   Tub/ Shower Transfer: Modified independent;Ambulation   Functional mobility during ADLs: Modified independent General ADL Comments: reviewed fall prevention and home safety with pt; handout provided     Vision Baseline Vision/History: No visual deficits Patient Visual Report: No change from baseline     Perception     Praxis      Cognition Arousal/Alertness: Awake/alert Behavior During Therapy: WFL for tasks assessed/performed Overall Cognitive Status: Within Functional Limits for tasks assessed                                          Exercises     Shoulder Instructions       General Comments      Pertinent Vitals/ Pain       Pain Assessment: 0-10 Pain Score: 3  Pain Location: scrotum Pain Descriptors / Indicators: Sore Pain Intervention(s): Monitored during session;Repositioned  Home Living                                          Prior Functioning/Environment  Frequency  Min 2X/week        Progress Toward Goals  OT Goals(current goals can now be found in the care plan section)  Progress towards OT goals: Progressing toward goals  Acute Rehab OT Goals Patient Stated Goal: to go home  Plan Discharge plan remains appropriate    Co-evaluation                 AM-PAC OT "6 Clicks" Daily Activity     Outcome Measure   Help from another person eating meals?: None Help from another person taking care of personal grooming?: None Help from another person toileting, which includes using toliet, bedpan, or urinal?: None Help from another person bathing (including washing, rinsing, drying)?: None Help from another person to put on and taking off regular upper body clothing?: None Help from another person to put on and  taking off regular lower body clothing?: None 6 Click Score: 24    End of Session    OT Visit Diagnosis: Unsteadiness on feet (R26.81);Other abnormalities of gait and mobility (R26.89);History of falling (Z91.81)   Activity Tolerance Patient tolerated treatment well   Patient Left (at EOB)   Nurse Communication          Time: 5732-2025 OT Time Calculation (min): 20 min  Charges: OT General Charges $OT Visit: 1 Visit OT Treatments $Self Care/Home Management : 8-22 mins     Britt Bottom 12/30/2019, 1:05 PM

## 2019-12-30 NOTE — Progress Notes (Signed)
PT Cancellation Note  Patient Details Name: Mathew Pierce MRN: [id number] DOB: 04-06-1946   Cancelled Treatment:    Reason Eval/Treat Not Completed: Patient declined, no reason specified Pt reports he will be going home in a few minutes and does not wish to work with PT. States he has been able to get to [facility] and chair without assistance safely. No further questions or concerns for mobility in and out of house at this time.  Ann Held PT, DPT Acute Rehab [facility] P: [phone]   Renato Gails 12/30/2019, 9:34 AM

## 2019-12-30 NOTE — Progress Notes (Signed)
Discharge instructions addressed; pt in stable condition;Pt to be pick up by spouse at the [employer] entrance.

## 2019-12-30 NOTE — Progress Notes (Signed)
Patient was written for discharge yesterday and was not as apparently nursing staff did not see the order.  He is stable today with no changes to his discharge summary from yesterday.  He is being discharged when his ride arrives.  Henreitta Cea 8:58 AM 12/30/2019

## 2019-12-30 NOTE — Progress Notes (Signed)
Bed side Shift Change report completed with day shift RN (R. Dulla). Day shift RN made aware and acknowledged that patient is to discharge this morning.

## 2020-04-24 ENCOUNTER — Encounter (HOSPITAL_BASED_OUTPATIENT_CLINIC_OR_DEPARTMENT_OTHER): Payer: Self-pay | Admitting: Emergency Medicine

## 2020-04-24 ENCOUNTER — Emergency Department (HOSPITAL_BASED_OUTPATIENT_CLINIC_OR_DEPARTMENT_OTHER)
Admission: EM | Admit: 2020-04-24 | Discharge: 2020-04-24 | Disposition: A | Discharge status: Home / Self Care | Payer: Medicare HMO | Attending: Emergency Medicine | Admitting: Emergency Medicine

## 2020-04-24 ENCOUNTER — Other Ambulatory Visit: Payer: Self-pay

## 2020-04-24 ENCOUNTER — Emergency Department (HOSPITAL_BASED_OUTPATIENT_CLINIC_OR_DEPARTMENT_OTHER): Payer: Medicare HMO

## 2020-04-24 DIAGNOSIS — R05 Cough: Secondary | ICD-10-CM | POA: Diagnosis present

## 2020-04-24 DIAGNOSIS — N186 End stage renal disease: Secondary | ICD-10-CM | POA: Diagnosis not present

## 2020-04-24 DIAGNOSIS — Z79899 Other long term (current) drug therapy: Secondary | ICD-10-CM | POA: Insufficient documentation

## 2020-04-24 DIAGNOSIS — R059 Cough, unspecified: Secondary | ICD-10-CM

## 2020-04-24 DIAGNOSIS — R0602 Shortness of breath: Secondary | ICD-10-CM | POA: Diagnosis not present

## 2020-04-24 DIAGNOSIS — Z7982 Long term (current) use of aspirin: Secondary | ICD-10-CM | POA: Insufficient documentation

## 2020-04-24 DIAGNOSIS — Z992 Dependence on renal dialysis: Secondary | ICD-10-CM | POA: Insufficient documentation

## 2020-04-24 DIAGNOSIS — Z7984 Long term (current) use of oral hypoglycemic drugs: Secondary | ICD-10-CM | POA: Insufficient documentation

## 2020-04-24 DIAGNOSIS — Z20822 Contact with and (suspected) exposure to covid-19: Secondary | ICD-10-CM | POA: Insufficient documentation

## 2020-04-24 DIAGNOSIS — R609 Edema, unspecified: Secondary | ICD-10-CM | POA: Diagnosis not present

## 2020-04-24 DIAGNOSIS — I120 Hypertensive chronic kidney disease with stage 5 chronic kidney disease or end stage renal disease: Secondary | ICD-10-CM | POA: Insufficient documentation

## 2020-04-24 DIAGNOSIS — E1122 Type 2 diabetes mellitus with diabetic chronic kidney disease: Secondary | ICD-10-CM | POA: Insufficient documentation

## 2020-04-24 LAB — BASIC METABOLIC PANEL
Anion gap: 15 (ref 5–15)
BUN: 17 mg/dL (ref 8–23)
CO2: 28 mmol/L (ref 22–32)
Calcium: 9.4 mg/dL (ref 8.9–10.3)
Chloride: 95 mmol/L — ABNORMAL LOW (ref 98–111)
Creatinine, Ser: 6.38 mg/dL — ABNORMAL HIGH (ref 0.61–1.24)
GFR calc Af Amer: 9 mL/min — ABNORMAL LOW (ref >60)
GFR calc non Af Amer: 8 mL/min — ABNORMAL LOW (ref >60)
Glucose, Bld: 126 mg/dL — ABNORMAL HIGH (ref 70–99)
Potassium: 3.7 mmol/L (ref 3.5–5.1)
Sodium: 138 mmol/L (ref 135–145)

## 2020-04-24 LAB — CBC WITH DIFFERENTIAL/PLATELET
Abs Immature Granulocytes: 0.02 10*3/uL (ref 0.00–0.07)
Basophils Absolute: 0.1 10*3/uL (ref 0.0–0.1)
Basophils Relative: 1 %
Eosinophils Absolute: 0.1 10*3/uL (ref 0.0–0.5)
Eosinophils Relative: 1 %
HCT: 37.3 % — ABNORMAL LOW (ref 39.0–52.0)
Hemoglobin: 12.2 g/dL — ABNORMAL LOW (ref 13.0–17.0)
Immature Granulocytes: 0 %
Lymphocytes Relative: 21 %
Lymphs Abs: 1 10*3/uL (ref 0.7–4.0)
MCH: 32 pg (ref 26.0–34.0)
MCHC: 32.7 g/dL (ref 30.0–36.0)
MCV: 97.9 fL (ref 80.0–100.0)
Monocytes Absolute: 0.4 10*3/uL (ref 0.1–1.0)
Monocytes Relative: 9 %
Neutro Abs: 3.1 10*3/uL (ref 1.7–7.7)
Neutrophils Relative %: 68 %
Platelets: 138 10*3/uL — ABNORMAL LOW (ref 150–400)
RBC: 3.81 MIL/uL — ABNORMAL LOW (ref 4.22–5.81)
RDW: 15.8 % — ABNORMAL HIGH (ref 11.5–15.5)
WBC: 4.6 10*3/uL (ref 4.0–10.5)
nRBC: 0 % (ref 0.0–0.2)

## 2020-04-24 LAB — SARS CORONAVIRUS 2 BY RT PCR (HOSPITAL ORDER, PERFORMED IN ~~LOC~~ HOSPITAL LAB): SARS Coronavirus 2: NEGATIVE

## 2020-04-24 NOTE — Discharge Instructions (Addendum)
Follow-up your Covid testing.  Please isolate until you hear back about your test.

## 2020-04-24 NOTE — ED Triage Notes (Signed)
Pt c/o shortness of breath x 2 days, intermittent cough x 1 week.  Reports productive coughing with clear secretions.

## 2020-04-24 NOTE — ED Provider Notes (Signed)
[facility] EMERGENCY DEPARTMENT Provider Note   CSN: [id number] Arrival date & time: 04/24/20  1109     History Chief Complaint  Patient presents with  . Cough    Mathew Pierce is a 74 y.o. male.  Patient with history of diabetes, end-stage renal disease on hemodialysis who presents to the ED with cough.  Not vaccinated against coronavirus.  Had dialysis yesterday.  Has not mentioned cough to anybody.  Denies any chest pain.  No major sputum production.  No fevers or chills.  No current shortness of breath.  Does have some intermittently.  Patient was fully dialyzed yesterday.  Was thought to be below his dry weight.  The history is provided by the patient.  Cough Cough characteristics:  Non-productive Sputum characteristics:  Nondescript Severity:  Mild Onset quality:  Gradual Timing:  Intermittent Progression:  Waxing and waning Chronicity:  New Smoker: no   Worsened by:  Nothing Ineffective treatments:  None tried Associated symptoms: shortness of breath   Associated symptoms: no chest pain, no chills, no ear pain, no fever, no rash and no sore throat        Past Medical History:  Diagnosis Date  . Diabetes mellitus without complication ([place])   . Dialysis patient ([place])   . Hypertension   . Renal disorder     Patient Active Problem List   Diagnosis Date Noted  . Hematoma 12/28/2019    Past Surgical History:  Procedure Laterality Date  . AV FISTULA PLACEMENT    . KIDNEY TRANSPLANT         No family history on file.  Social History   Tobacco Use  . Smoking status: Never Smoker  . Smokeless tobacco: Never Used  Vaping Use  . Vaping Use: Never used  Substance Use Topics  . Alcohol use: Never  . Drug use: Never    Home Medications Prior to Admission medications   Medication Sig Start Date End Date Taking? Authorizing Provider  acetaminophen (TYLENOL) 500 MG tablet Take 2 tablets (1,000 mg total) by mouth every 6 (six) hours as needed.  12/29/19  Yes Saverio Danker, PA-C  amLODipine (NORVASC) 10 MG tablet Take 10 mg by mouth daily. 12/11/19  Yes [provider]  aspirin EC 81 MG tablet Take 81 mg by mouth daily.   Yes [provider]  atorvastatin (LIPITOR) 40 MG tablet Take 40 mg by mouth daily at 6 PM.  12/11/19  Yes [provider]  carvedilol (COREG) 6.25 MG tablet Take 6.25 mg by mouth 2 (two) times daily. 12/11/19  Yes [provider]  CELLCEPT 250 MG capsule Take 250 mg by mouth 2 (two) times daily. 11/13/19  Yes [provider]  folic acid (FOLVITE) 1 MG tablet Take 1 mg by mouth daily. 12/11/19  Yes [provider]  insulin detemir (LEVEMIR) 100 UNIT/ML injection Inject 20 Units into the skin 2 (two) times daily.   Yes [provider]  magnesium oxide (MAG-OX) 400 MG tablet Take 400 mg by mouth daily.  12/11/19  Yes [provider]  pantoprazole (PROTONIX) 40 MG tablet Take 40 mg by mouth 2 (two) times daily. 12/11/19  Yes [provider]  predniSONE (DELTASONE) 5 MG tablet Take 5 mg by mouth daily. 12/11/19  Yes [provider]  PROGRAF 1 MG capsule Take 2 mg by mouth 2 (two) times daily. 12/11/19  Yes [provider]  traMADol (ULTRAM) 50 MG tablet Take 1 tablet (50 mg total) by  mouth every 6 (six) hours as needed. 12/29/19 12/28/20 Yes Saverio Danker, PA-C    Allergies    Patient has no known allergies.  Review of Systems   Review of Systems  Constitutional: Negative for chills and fever.  HENT: Negative for ear pain and sore throat.   Eyes: Negative for pain and visual disturbance.  Respiratory: Positive for cough and shortness of breath.   Cardiovascular: Negative for chest pain and palpitations.  Gastrointestinal: Negative for abdominal pain and vomiting.  Genitourinary: Negative for dysuria and hematuria.  Musculoskeletal: Negative for arthralgias and back pain.  Skin: Negative for color change and rash.  Neurological: Negative  for seizures and syncope.  All other systems reviewed and are negative.   Physical Exam Updated Vital Signs  ED Triage Vitals  Enc Vitals Group     BP 04/24/20 1118 (!) 161/82     Pulse Rate 04/24/20 1118 81     Resp 04/24/20 1118 16     Temp 04/24/20 1118 98.1 F (36.7 C)     Temp Source 04/24/20 1118 Oral     SpO2 04/24/20 1118 96 %     Weight 04/24/20 1118 173 lb (78.5 kg)     Height 04/24/20 1118 5\' 7"  (1.702 m)     Head Circumference --      Peak Flow --      Pain Score 04/24/20 1117 1     Pain Loc --      Pain Edu? --      Excl. in [place]? --     Physical Exam Vitals and nursing note reviewed.  Constitutional:      Appearance: He is well-developed.  HENT:     Head: Normocephalic and atraumatic.     Nose: Nose normal.     Mouth/Throat:     Mouth: Mucous membranes are moist.  Eyes:     Extraocular Movements: Extraocular movements intact.     Conjunctiva/sclera: Conjunctivae normal.     Pupils: Pupils are equal, round, and reactive to light.  Cardiovascular:     Rate and Rhythm: Normal rate and regular rhythm.     Pulses: Normal pulses.     Heart sounds: Normal heart sounds. No murmur heard.   Pulmonary:     Effort: Pulmonary effort is normal. No respiratory distress.     Breath sounds: Normal breath sounds. No wheezing, rhonchi or rales.  Abdominal:     General: Abdomen is flat.     Palpations: Abdomen is soft.     Tenderness: There is no abdominal tenderness.  Musculoskeletal:     Cervical back: Normal range of motion and neck supple.     Right lower leg: Edema present.     Left lower leg: Edema present.     Comments: Trace edema bilaterally  Skin:    General: Skin is warm and dry.     Capillary Refill: Capillary refill takes less than 2 seconds.  Neurological:     General: No focal deficit present.     Mental Status: He is alert.     ED Results / Procedures / Treatments   Labs (all labs ordered are listed, but only abnormal results are  displayed) Labs Reviewed  CBC WITH DIFFERENTIAL/PLATELET - Abnormal; Notable for the following components:      Result Value   RBC 3.81 (*)    Hemoglobin 12.2 (*)    HCT 37.3 (*)    RDW 15.8 (*)    Platelets 138 (*)  All other components within normal limits  BASIC METABOLIC PANEL - Abnormal; Notable for the following components:   Chloride 95 (*)    Glucose, Bld 126 (*)    Creatinine, Ser 6.38 (*)    GFR calc non Af Amer 8 (*)    GFR calc Af Amer 9 (*)    All other components within normal limits  SARS CORONAVIRUS 2 BY RT PCR [facility] ORDER, [place] [facility])    EKG EKG Interpretation  Date/Time:  Sunday April 24 2020 11:32:30 EDT Ventricular Rate:  77 PR Interval:    QRS Duration: 111 QT Interval:  434 QTC Calculation: 492 R Axis:   -31 Text Interpretation: Sinus rhythm Prolonged PR interval Left axis deviation Confirmed by Lennice Sites [phone]) on 04/24/2020 11:43:05 AM   Radiology DG Chest Portable 1 View  Result Date: 04/24/2020 CLINICAL DATA:  Shortness of breath, cough. EXAM: PORTABLE CHEST 1 VIEW COMPARISON:  April 21, 2019. FINDINGS: Stable cardiomegaly. No pneumothorax is noted. Mild left lingular subsegmental atelectasis is noted. Bibasilar atelectasis or edema is noted. Small left pleural effusion may be present. Bony thorax is unremarkable. IMPRESSION: Mild left lingular subsegmental atelectasis. Bibasilar atelectasis or edema is noted with possible small left pleural effusion. Electronically Signed   By: Marijo Conception M.D.   On: 04/24/2020 12:31    Procedures Procedures (including critical care time)  Medications Ordered in ED Medications - No data to display  ED Course  I have reviewed the triage vital signs and the nursing notes.  Pertinent labs & imaging results that were available during my care of the patient were reviewed by me and considered in my medical decision making (see chart for details).    MDM  Rules/Calculators/A&P                          Mathew Pierce is a 74 year old male with history of diabetes, hypertension, end-stage renal disease who presents to the ED with cough.  Patient had dialysis yesterday.  Vital signs overall unremarkable.  No chest pain.  EKG shows sinus rhythm.  Has had nonspecific cough for the last week.  Not vaccinated against coronavirus.  Denies any fevers or chills.  Said that he was likely below his dry weight.  Does have some trace edema in his legs.  Normal room air oxygenation.  No increased work of breathing.  Will check basic labs including chest x-ray and Covid testing.  No chest pain.  Doubt cardiac process.  Patient with no significant anemia, electrolyte abnormality.  No white count.  No fever.  Chest x-ray overall shows some atelectasis versus some mild edema.  No major signs of volume overload on exam.  Overall suspect cough likely from viral process or from atelectasis/some mild edema.  Has been compliant with his dialysis.  No need for emergent dialysis this patient is resting comfortably.  Normal room air oxygenation.  Covid test is pending.  Discharged from the ED in good condition.  This chart was dictated using voice recognition software.  Despite best efforts to proofread,  errors can occur which can change the documentation meaning.   Mathew Pierce was evaluated in Emergency Department on 04/24/2020 for the symptoms described in the history of present illness. He was evaluated in the context of the global COVID-19 pandemic, which necessitated consideration that the patient might be at risk for infection with the SARS-CoV-2 virus that causes COVID-19. Institutional protocols and  algorithms that pertain to the evaluation of patients at risk for COVID-19 are in a state of rapid change based on information released by regulatory bodies including the CDC and federal and state organizations. These policies and algorithms were followed during the patient's care  in the ED.  Final Clinical Impression(s) / ED Diagnoses Final diagnoses:  Cough    Rx / DC Orders ED Discharge Orders    None       Lennice Sites, DO 04/24/20 1248

## 2020-04-24 NOTE — ED Notes (Signed)
XR at bedside

## 2021-02-07 ENCOUNTER — Encounter (HOSPITAL_BASED_OUTPATIENT_CLINIC_OR_DEPARTMENT_OTHER): Payer: Self-pay | Admitting: *Deleted

## 2021-02-07 ENCOUNTER — Emergency Department (HOSPITAL_BASED_OUTPATIENT_CLINIC_OR_DEPARTMENT_OTHER): Payer: Medicare HMO

## 2021-02-07 ENCOUNTER — Other Ambulatory Visit: Payer: Self-pay

## 2021-02-07 ENCOUNTER — Inpatient Hospital Stay (HOSPITAL_BASED_OUTPATIENT_CLINIC_OR_DEPARTMENT_OTHER)
Admission: EM | Admit: 2021-02-07 | Discharge: 2021-02-12 | DRG: 871 | Disposition: A | Discharge status: Home Health | Payer: Medicare HMO | Attending: Internal Medicine | Admitting: Internal Medicine

## 2021-02-07 DIAGNOSIS — U071 COVID-19: Secondary | ICD-10-CM | POA: Diagnosis present

## 2021-02-07 DIAGNOSIS — Z28310 Unvaccinated for covid-19: Secondary | ICD-10-CM

## 2021-02-07 DIAGNOSIS — I255 Ischemic cardiomyopathy: Secondary | ICD-10-CM | POA: Diagnosis present

## 2021-02-07 DIAGNOSIS — Z794 Long term (current) use of insulin: Secondary | ICD-10-CM

## 2021-02-07 DIAGNOSIS — A4189 Other specified sepsis: Secondary | ICD-10-CM | POA: Diagnosis not present

## 2021-02-07 DIAGNOSIS — R0902 Hypoxemia: Secondary | ICD-10-CM

## 2021-02-07 DIAGNOSIS — J9601 Acute respiratory failure with hypoxia: Secondary | ICD-10-CM | POA: Diagnosis present

## 2021-02-07 DIAGNOSIS — Z23 Encounter for immunization: Secondary | ICD-10-CM

## 2021-02-07 DIAGNOSIS — Z7902 Long term (current) use of antithrombotics/antiplatelets: Secondary | ICD-10-CM

## 2021-02-07 DIAGNOSIS — Y838 Other surgical procedures as the cause of abnormal reaction of the patient, or of later complication, without mention of misadventure at the time of the procedure: Secondary | ICD-10-CM | POA: Diagnosis present

## 2021-02-07 DIAGNOSIS — E119 Type 2 diabetes mellitus without complications: Secondary | ICD-10-CM

## 2021-02-07 DIAGNOSIS — I5022 Chronic systolic (congestive) heart failure: Secondary | ICD-10-CM | POA: Diagnosis present

## 2021-02-07 DIAGNOSIS — S2249XA Multiple fractures of ribs, unspecified side, initial encounter for closed fracture: Secondary | ICD-10-CM | POA: Diagnosis present

## 2021-02-07 DIAGNOSIS — E1122 Type 2 diabetes mellitus with diabetic chronic kidney disease: Secondary | ICD-10-CM | POA: Diagnosis present

## 2021-02-07 DIAGNOSIS — I2699 Other pulmonary embolism without acute cor pulmonale: Secondary | ICD-10-CM | POA: Diagnosis present

## 2021-02-07 DIAGNOSIS — S2242XA Multiple fractures of ribs, left side, initial encounter for closed fracture: Secondary | ICD-10-CM | POA: Diagnosis present

## 2021-02-07 DIAGNOSIS — I2510 Atherosclerotic heart disease of native coronary artery without angina pectoris: Secondary | ICD-10-CM | POA: Diagnosis present

## 2021-02-07 DIAGNOSIS — I10 Essential (primary) hypertension: Secondary | ICD-10-CM | POA: Diagnosis present

## 2021-02-07 DIAGNOSIS — Y929 Unspecified place or not applicable: Secondary | ICD-10-CM

## 2021-02-07 DIAGNOSIS — I5082 Biventricular heart failure: Secondary | ICD-10-CM | POA: Diagnosis present

## 2021-02-07 DIAGNOSIS — D72819 Decreased white blood cell count, unspecified: Secondary | ICD-10-CM | POA: Diagnosis present

## 2021-02-07 DIAGNOSIS — N186 End stage renal disease: Secondary | ICD-10-CM

## 2021-02-07 DIAGNOSIS — D638 Anemia in other chronic diseases classified elsewhere: Secondary | ICD-10-CM | POA: Diagnosis present

## 2021-02-07 DIAGNOSIS — Z79899 Other long term (current) drug therapy: Secondary | ICD-10-CM

## 2021-02-07 DIAGNOSIS — J440 Chronic obstructive pulmonary disease with acute lower respiratory infection: Secondary | ICD-10-CM | POA: Diagnosis present

## 2021-02-07 DIAGNOSIS — I248 Other forms of acute ischemic heart disease: Secondary | ICD-10-CM | POA: Diagnosis present

## 2021-02-07 DIAGNOSIS — J1282 Pneumonia due to coronavirus disease 2019: Secondary | ICD-10-CM | POA: Diagnosis present

## 2021-02-07 DIAGNOSIS — J159 Unspecified bacterial pneumonia: Secondary | ICD-10-CM | POA: Diagnosis present

## 2021-02-07 DIAGNOSIS — F32A Depression, unspecified: Secondary | ICD-10-CM | POA: Diagnosis present

## 2021-02-07 DIAGNOSIS — I132 Hypertensive heart and chronic kidney disease with heart failure and with stage 5 chronic kidney disease, or end stage renal disease: Secondary | ICD-10-CM | POA: Diagnosis present

## 2021-02-07 DIAGNOSIS — R778 Other specified abnormalities of plasma proteins: Secondary | ICD-10-CM

## 2021-02-07 DIAGNOSIS — D696 Thrombocytopenia, unspecified: Secondary | ICD-10-CM | POA: Diagnosis present

## 2021-02-07 DIAGNOSIS — A419 Sepsis, unspecified organism: Secondary | ICD-10-CM | POA: Diagnosis present

## 2021-02-07 DIAGNOSIS — I472 Ventricular tachycardia: Secondary | ICD-10-CM | POA: Diagnosis present

## 2021-02-07 DIAGNOSIS — Z992 Dependence on renal dialysis: Secondary | ICD-10-CM

## 2021-02-07 DIAGNOSIS — Z955 Presence of coronary angioplasty implant and graft: Secondary | ICD-10-CM

## 2021-02-07 DIAGNOSIS — Z7982 Long term (current) use of aspirin: Secondary | ICD-10-CM

## 2021-02-07 DIAGNOSIS — R54 Age-related physical debility: Secondary | ICD-10-CM | POA: Diagnosis present

## 2021-02-07 DIAGNOSIS — J189 Pneumonia, unspecified organism: Secondary | ICD-10-CM

## 2021-02-07 DIAGNOSIS — T8612 Kidney transplant failure: Secondary | ICD-10-CM | POA: Diagnosis present

## 2021-02-07 DIAGNOSIS — Z7952 Long term (current) use of systemic steroids: Secondary | ICD-10-CM

## 2021-02-07 DIAGNOSIS — I4891 Unspecified atrial fibrillation: Secondary | ICD-10-CM | POA: Diagnosis present

## 2021-02-07 HISTORY — DX: Disorder of kidney and ureter, unspecified: N28.9

## 2021-02-07 LAB — COMPREHENSIVE METABOLIC PANEL
ALT: 13 U/L (ref 0–44)
AST: 32 U/L (ref 15–41)
Albumin: 3.2 g/dL — ABNORMAL LOW (ref 3.5–5.0)
Alkaline Phosphatase: 133 U/L — ABNORMAL HIGH (ref 38–126)
Anion gap: 16 — ABNORMAL HIGH (ref 5–15)
BUN: 19 mg/dL (ref 8–23)
CO2: 25 mmol/L (ref 22–32)
Calcium: 9.3 mg/dL (ref 8.9–10.3)
Chloride: 93 mmol/L — ABNORMAL LOW (ref 98–111)
Creatinine, Ser: 5.74 mg/dL — ABNORMAL HIGH (ref 0.61–1.24)
GFR, Estimated: 10 mL/min — ABNORMAL LOW (ref >60)
Glucose, Bld: 113 mg/dL — ABNORMAL HIGH (ref 70–99)
Potassium: 4.2 mmol/L (ref 3.5–5.1)
Sodium: 134 mmol/L — ABNORMAL LOW (ref 135–145)
Total Bilirubin: 1.7 mg/dL — ABNORMAL HIGH (ref 0.3–1.2)
Total Protein: 8.2 g/dL — ABNORMAL HIGH (ref 6.5–8.1)

## 2021-02-07 LAB — LIPASE, BLOOD: Lipase: 45 U/L (ref 11–51)

## 2021-02-07 LAB — CBC WITH DIFFERENTIAL/PLATELET
Abs Immature Granulocytes: 0.04 10*3/uL (ref 0.00–0.07)
Basophils Absolute: 0 10*3/uL (ref 0.0–0.1)
Basophils Relative: 1 %
Eosinophils Absolute: 0 10*3/uL (ref 0.0–0.5)
Eosinophils Relative: 0 %
HCT: 38.4 % — ABNORMAL LOW (ref 39.0–52.0)
Hemoglobin: 12.6 g/dL — ABNORMAL LOW (ref 13.0–17.0)
Immature Granulocytes: 1 %
Lymphocytes Relative: 20 %
Lymphs Abs: 1 10*3/uL (ref 0.7–4.0)
MCH: 32.1 pg (ref 26.0–34.0)
MCHC: 32.8 g/dL (ref 30.0–36.0)
MCV: 97.7 fL (ref 80.0–100.0)
Monocytes Absolute: 0.7 10*3/uL (ref 0.1–1.0)
Monocytes Relative: 16 %
Neutro Abs: 3 10*3/uL (ref 1.7–7.7)
Neutrophils Relative %: 62 %
Platelets: 125 10*3/uL — ABNORMAL LOW (ref 150–400)
RBC: 3.93 MIL/uL — ABNORMAL LOW (ref 4.22–5.81)
RDW: 16.8 % — ABNORMAL HIGH (ref 11.5–15.5)
WBC: 4.8 10*3/uL (ref 4.0–10.5)
nRBC: 0 % (ref 0.0–0.2)

## 2021-02-07 LAB — TROPONIN I (HIGH SENSITIVITY)
Troponin I (High Sensitivity): 253 ng/L (ref <18)
Troponin I (High Sensitivity): 298 ng/L (ref <18)

## 2021-02-07 LAB — RESP PANEL BY RT-PCR (FLU A&B, COVID) ARPGX2
Influenza A by PCR: NEGATIVE
Influenza B by PCR: NEGATIVE
SARS Coronavirus 2 by RT PCR: POSITIVE — AB

## 2021-02-07 LAB — AMMONIA: Ammonia: 37 umol/L — ABNORMAL HIGH (ref 9–35)

## 2021-02-07 LAB — LACTIC ACID, PLASMA: Lactic Acid, Venous: 2.1 mmol/L (ref 0.5–1.9)

## 2021-02-07 LAB — MAGNESIUM: Magnesium: 1.8 mg/dL (ref 1.7–2.4)

## 2021-02-07 MED ORDER — SODIUM CHLORIDE 0.9 % IV SOLN
100.0000 mg | Freq: Once | INTRAVENOUS | Status: AC
  Administered 2021-02-07: 100 mg via INTRAVENOUS
  Filled 2021-02-07: qty 100

## 2021-02-07 MED ORDER — ALBUTEROL SULFATE HFA 108 (90 BASE) MCG/ACT IN AERS
2.0000 | INHALATION_SPRAY | Freq: Once | RESPIRATORY_TRACT | Status: AC
  Administered 2021-02-07: 2 via RESPIRATORY_TRACT
  Filled 2021-02-07: qty 6.7

## 2021-02-07 MED ORDER — SODIUM CHLORIDE 0.9 % IV SOLN
INTRAVENOUS | Status: DC | PRN
  Administered 2021-02-07: 250 mL via INTRAVENOUS

## 2021-02-07 MED ORDER — SODIUM CHLORIDE 0.9 % IV SOLN
100.0000 mg | Freq: Every day | INTRAVENOUS | Status: AC
  Administered 2021-02-08 – 2021-02-11 (×4): 100 mg via INTRAVENOUS
  Filled 2021-02-07 (×4): qty 20

## 2021-02-07 MED ORDER — SODIUM CHLORIDE 0.9 % IV SOLN
100.0000 mg | INTRAVENOUS | Status: AC
  Administered 2021-02-08 (×2): 100 mg via INTRAVENOUS

## 2021-02-07 MED ORDER — HEPARIN (PORCINE) 25000 UT/250ML-% IV SOLN
1150.0000 [IU]/h | INTRAVENOUS | Status: DC
  Administered 2021-02-08: 1250 [IU]/h via INTRAVENOUS
  Administered 2021-02-08: 1000 [IU]/h via INTRAVENOUS
  Administered 2021-02-09: 1150 [IU]/h via INTRAVENOUS
  Filled 2021-02-07 (×3): qty 250

## 2021-02-07 MED ORDER — IOHEXOL 350 MG/ML SOLN
100.0000 mL | Freq: Once | INTRAVENOUS | Status: AC | PRN
  Administered 2021-02-07: 100 mL via INTRAVENOUS

## 2021-02-07 MED ORDER — DEXAMETHASONE SODIUM PHOSPHATE 10 MG/ML IJ SOLN
10.0000 mg | Freq: Once | INTRAMUSCULAR | Status: AC
  Administered 2021-02-07: 10 mg via INTRAVENOUS
  Filled 2021-02-07: qty 1

## 2021-02-07 MED ORDER — SODIUM CHLORIDE 0.9 % IV SOLN
2.0000 g | Freq: Once | INTRAVENOUS | Status: AC
  Administered 2021-02-07: 2 g via INTRAVENOUS
  Filled 2021-02-07: qty 2

## 2021-02-07 NOTE — ED Notes (Signed)
Pt. Reports he is here due to feeling pain in his abd. On the left and R  Side and has poor history of self.  Pt. Also reports he feels bad and has felt bad for weeks.  Pt. Resting with eyes closed.  Pt. Is bare foot. And sees to self and want to sleep

## 2021-02-07 NOTE — ED Notes (Signed)
Pt. Now tells RN he fell yesterday and that is why his L side is hurting and he has rib pain on the L side.

## 2021-02-07 NOTE — ED Notes (Signed)
PT refusing Swab at this time. MD informed and will retry upon release of PTs results

## 2021-02-07 NOTE — ED Notes (Signed)
Pt. Is resting with eyes closed and no distress noted.

## 2021-02-07 NOTE — Progress Notes (Signed)
ANTICOAGULATION CONSULT NOTE - Initial Consult  Pharmacy Consult for heparin Indication: Afib and possible PE  No Known Allergies  Patient Measurements: Height: '5\' 7"'$  (170.2 cm) Weight: 78.5 kg (173 lb 1 oz) IBW/kg (Calculated) : 66.1  Vital Signs: Temp: 99.2 F (37.3 C) (05/03 1618) BP: 122/49 (05/03 2230) Pulse Rate: 77 (05/03 2245)  Labs: Recent Labs    02/07/21 1711 02/07/21 1733 02/07/21 1756 02/07/21 2018  HGB 12.6* 12.9*  --   --   HCT 38.4* 38.0*  --   --   PLT 125*  --   --   --   CREATININE  --   --  5.74*  --   TROPONINIHS 253*  --   --  298*    Estimated Creatinine Clearance: 10.4 mL/min (A) (by C-G formula based on SCr of 5.74 mg/dL (H)).   Medical History: Past Medical History:  Diagnosis Date  . Diabetes mellitus without complication ([place])   . Dialysis patient ([place])   . Hypertension   . Renal disorder   . Renal insufficiency     Assessment: 75yo male presents to ED c/o LUQ abdominal pain but upon further exam pt also notes pleuritic CP that worsens with inspiration as well as somnolence, found to be in new Afib and CT suggests PE though inconclusively, to begin heparin.  Of note pt reports that he does not get AC during HD d/t GIB so will dose heparin cautiously.  Goal of Therapy:  Heparin level 0.3-0.7 units/ml Monitor platelets by anticoagulation protocol: Yes   Plan:  Will begin heparin gtt at 1000 units/hr without bolus and monitor heparin levels and CBC.  Wynona Neat, PharmD, BCPS  02/07/2021,11:24 PM

## 2021-02-07 NOTE — ED Triage Notes (Signed)
Left mid quad abdominal pain since yesterday. He is a dialysis pt who was dialyzed yesterday. Graft to his right arm.

## 2021-02-07 NOTE — ED Notes (Signed)
RT assessed patient in room. SAT 81% on RA. Patient stated that he wears o2 at Dialysis during every treatment. Currently on 5LNC, SAT 93%. RN and MD aware

## 2021-02-07 NOTE — ED Notes (Signed)
Able to only get one culture

## 2021-02-07 NOTE — ED Provider Notes (Signed)
[facility] Provider Note   CSN: [id number] Arrival date & time: 02/07/21  1609     History Chief Complaint  Patient presents with  . Abdominal Pain    Mathew Pierce is a 75 y.o. male.  The history is provided by the patient and medical records. No language interpreter was used.  Abdominal Pain Pain location:  LUQ Pain quality: aching and sharp   Pain radiates to:  Chest Pain severity:  Severe Onset quality:  Gradual Duration:  2 days Timing:  Constant Progression:  Unchanged Chronicity:  New Relieved by:  Nothing Worsened by:  Palpation, coughing and deep breathing Associated symptoms: chest pain, cough, fatigue and shortness of breath   Associated symptoms: no anorexia, no chills, no constipation, no diarrhea, no fever, no nausea and no vomiting        Past Medical History:  Diagnosis Date  . Diabetes mellitus without complication ([place])   . Dialysis patient ([place])   . Hypertension   . Renal disorder     Patient Active Problem List   Diagnosis Date Noted  . Hematoma 12/28/2019    Past Surgical History:  Procedure Laterality Date  . AV FISTULA PLACEMENT    . KIDNEY TRANSPLANT         No family history on file.  Social History   Tobacco Use  . Smoking status: Never Smoker  . Smokeless tobacco: Never Used  Vaping Use  . Vaping Use: Never used  Substance Use Topics  . Alcohol use: Never  . Drug use: Never    Home Medications Prior to Admission medications   Medication Sig Start Date End Date Taking? Authorizing Provider  acetaminophen (TYLENOL) 500 MG tablet Take 2 tablets (1,000 mg total) by mouth every 6 (six) hours as needed. 12/29/19   Mathew Danker, PA-C  amLODipine (NORVASC) 10 MG tablet Take 10 mg by mouth daily. 12/11/19   [provider]  aspirin EC 81 MG tablet Take 81 mg by mouth daily.    [provider]  atorvastatin (LIPITOR) 40 MG tablet Take 40 mg by mouth daily at 6 PM.  12/11/19    [provider]  carvedilol (COREG) 6.25 MG tablet Take 6.25 mg by mouth 2 (two) times daily. 12/11/19   [provider]  CELLCEPT 250 MG capsule Take 250 mg by mouth 2 (two) times daily. 11/13/19   [provider]  folic acid (FOLVITE) 1 MG tablet Take 1 mg by mouth daily. 12/11/19   [provider]  insulin detemir (LEVEMIR) 100 UNIT/ML injection Inject 20 Units into the skin 2 (two) times daily.    [provider]  magnesium oxide (MAG-OX) 400 MG tablet Take 400 mg by mouth daily.  12/11/19   [provider]  pantoprazole (PROTONIX) 40 MG tablet Take 40 mg by mouth 2 (two) times daily. 12/11/19   [provider]  predniSONE (DELTASONE) 5 MG tablet Take 5 mg by mouth daily. 12/11/19   [provider]  PROGRAF 1 MG capsule Take 2 mg by mouth 2 (two) times daily. 12/11/19   [provider]    Allergies    Patient has no known allergies.  Review of Systems   Review of Systems  Constitutional: Positive for fatigue. Negative for chills, diaphoresis and fever.  HENT: Negative for congestion.   Respiratory: Positive for cough and shortness of breath. Negative for chest tightness and wheezing.   Cardiovascular: Positive for chest pain. Negative for palpitations and  leg swelling.  Gastrointestinal: Positive for abdominal pain. Negative for anorexia, constipation, diarrhea, nausea and vomiting.  Genitourinary: Positive for flank pain.       Does not make urine  Musculoskeletal: Negative for back pain and neck pain.  Neurological: Negative for light-headedness and headaches.  Psychiatric/Behavioral: Negative for agitation.  All other systems reviewed and are negative.   Physical Exam Updated Vital Signs BP 139/63 (BP Location: Left Arm)   Pulse 74   Temp 99.2 F (37.3 C)   Resp 20   Ht '5\' 7"'$  (1.702 m)   Wt 78.5 kg   SpO2 91%   BMI 27.11 kg/m   Physical Exam Vitals and nursing note reviewed.  Constitutional:       General: He is not in acute distress.    Appearance: He is well-developed. He is not ill-appearing, toxic-appearing or diaphoretic.  HENT:     Head: Normocephalic and atraumatic.  Eyes:     Conjunctiva/sclera: Conjunctivae normal.  Cardiovascular:     Rate and Rhythm: Normal rate and regular rhythm.     Heart sounds: No murmur heard.   Pulmonary:     Effort: Pulmonary effort is normal. No respiratory distress.     Breath sounds: Rhonchi present. No wheezing or rales.  Chest:     Chest wall: Tenderness present.    Abdominal:     General: Abdomen is flat. Bowel sounds are normal. There is no distension.     Palpations: Abdomen is soft.     Tenderness: There is abdominal tenderness in the left upper quadrant. There is no right CVA tenderness, left CVA tenderness, guarding or rebound.    Musculoskeletal:     Cervical back: Neck supple.  Skin:    General: Skin is warm and dry.  Neurological:     Sensory: No sensory deficit.     Motor: No tremor or abnormal muscle tone.     Comments: Patient is somnolent     ED Results / Procedures / Treatments   Labs (all labs ordered are listed, but only abnormal results are displayed) Labs Reviewed  RESP PANEL BY RT-PCR (FLU A&B, COVID) ARPGX2 - Abnormal; Notable for the following components:      Result Value   SARS Coronavirus 2 by RT PCR POSITIVE (*)    All other components within normal limits  CBC WITH DIFFERENTIAL/PLATELET - Abnormal; Notable for the following components:   RBC 3.93 (*)    Hemoglobin 12.6 (*)    HCT 38.4 (*)    RDW 16.8 (*)    Platelets 125 (*)    All other components within normal limits  COMPREHENSIVE METABOLIC PANEL - Abnormal; Notable for the following components:   Sodium 134 (*)    Chloride 93 (*)    Glucose, Bld 113 (*)    Creatinine, Ser 5.74 (*)    Total Protein 8.2 (*)    Albumin 3.2 (*)    Alkaline Phosphatase 133 (*)    Total Bilirubin 1.7 (*)    GFR, Estimated 10 (*)    Anion gap 16 (*)     All other components within normal limits  LACTIC ACID, PLASMA - Abnormal; Notable for the following components:   Lactic Acid, Venous 2.1 (*)    All other components within normal limits  AMMONIA - Abnormal; Notable for the following components:   Ammonia 37 (*)    All other components within normal limits  I-STAT ARTERIAL BLOOD GAS, ED - Abnormal; Notable for the following components:  pH, Arterial 7.736 (*)    pCO2 arterial 20.2 (*)    pO2, Arterial 209 (*)    Acid-Base Excess 9.0 (*)    Sodium 127 (*)    Potassium 6.6 (*)    Calcium, Ion 0.74 (*)    HCT 38.0 (*)    Hemoglobin 12.9 (*)    All other components within normal limits  TROPONIN I (HIGH SENSITIVITY) - Abnormal; Notable for the following components:   Troponin I (High Sensitivity) 253 (*)    All other components within normal limits  TROPONIN I (HIGH SENSITIVITY) - Abnormal; Notable for the following components:   Troponin I (High Sensitivity) 298 (*)    All other components within normal limits  CULTURE, BLOOD (ROUTINE X 2)  CULTURE, BLOOD (ROUTINE X 2)  LIPASE, BLOOD  MAGNESIUM  BLOOD GAS, ARTERIAL  PROCALCITONIN  PROCALCITONIN  C-REACTIVE PROTEIN  D-DIMER, QUANTITATIVE    EKG EKG Interpretation  Date/Time:  Tuesday Feb 07 2021 16:19:58 EDT Ventricular Rate:  79 PR Interval:    QRS Duration: 92 QT Interval:  450 QTC Calculation: 516 R Axis:   -24 Text Interpretation: Atrial fibrillation Inferior infarct , age undetermined Anterolateral infarct , age undetermined Prolonged QT Abnormal ECG when compared to prior, new afib abd longer QTc. No STEMI Confirmed by Antony Blackbird [phone]) on 02/07/2021 5:14:50 PM   Radiology CT Angio Chest PE W and/or Wo Contrast  Result Date: 02/07/2021 CLINICAL DATA:  Pleuritic left chest pain and shortness of breath with hypoxia. Abdominal pain. Malaise. COVID positive today in the emergency department. EXAM: CT ANGIOGRAPHY CHEST CT ABDOMEN AND PELVIS WITH CONTRAST TECHNIQUE:  Multidetector CT imaging of the chest was performed using the standard protocol during bolus administration of intravenous contrast. Multiplanar CT image reconstructions and MIPs were obtained to evaluate the vascular anatomy. Multidetector CT imaging of the abdomen and pelvis was performed using the standard protocol during bolus administration of intravenous contrast. CONTRAST:  168m OMNIPAQUE IOHEXOL 350 MG/ML SOLN COMPARISON:  CT abdomen 12/27/2019. FINDINGS: CTA CHEST FINDINGS Cardiovascular: Subsegmental filling defect in the right lower lobe pulmonary artery posterior basal region favors chronic pulmonary embolus although a small acute embolic component cannot be totally excluded. This is subsegmental and subtle and no other findings of pulmonary embolus are identified in the lungs. These findings are shown on images 51 through 61 of series 3 in the corresponding thin-section images. Coronary, aortic arch, and branch vessel atherosclerotic vascular disease. Right axillary vein stent. Moderate cardiomegaly with coronary artery atherosclerotic vascular calcification. Prominent hepatic vein likely from poor right heart function. Mediastinum/Nodes: AP window lymph node 0.8 cm in short axis, within normal limits. No pathologic adenopathy identified. Lungs/Pleura: Small right and trace left pleural effusions. Hazy airspace opacity in the posterior basal segments of both lungs favoring atelectasis over pneumonia. Currently we do not show the peripheral ground-glass opacity scattered in the lungs characteristic of COVID pneumonia. Musculoskeletal: There are acute fractures of the left seventh, eighth, and ninth ribs posterolaterally. 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 Bridging spurring anteriorly through most of the thoracic spine. Chronic anterior wedging at T12. Review of the MIP images confirms the above findings. CT ABDOMEN and PELVIS FINDINGS Hepatobiliary: Faint heterogeneity in the  liver potentially from hepatic congestion no compelling morphology of cirrhosis. Correlate with liver enzymes and potentially hepatitis panel given the heterogeneous enhancement in the liver. High density in the indistinct and blurred gallbladder, possibly from gallstones. No biliary dilatation. Pancreas: Unremarkable Spleen: Unremarkable Adrenals/Urinary Tract: Both adrenal  glands appear normal. Severe bilateral renal atrophy. 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 Stomach/Bowel: Unremarkable Vascular/Lymphatic: Prominent atherosclerotic vascular calcification. No pathologic adenopathy identified. Reproductive: Unremarkable Other: Small amount of ascites. Mild mesenteric edema and mild subcutaneous edema. 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 Review of the MIP images confirms the above findings. IMPRESSION: 1. Questionable subsegmental filling defect in a branch of the right lower lobe posterior basal segment pulmonary artery. This could well be from chronic pulmonary embolus given the somewhat peripheral location, but subsegmental acute pulmonary embolus is difficult to completely exclude. No segmental or lobar pulmonary embolus is identified. 2. Moderate cardiomegaly with extensive atherosclerosis throughout the chest, abdomen, and pelvis. 3. 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 4. Small right and trace left pleural effusions. Minimal bibasilar atelectasis. 5. Acute fractures of the left seventh, eighth, and ninth ribs posterolaterally. 6. 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 7. 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 8. Small amount of ascites with diffuse mesenteric and subcutaneous edema compatible with third spacing of fluid. Electronically Signed   By: Van Clines  M.D.   On: 02/07/2021 21:12   CT ABDOMEN PELVIS W CONTRAST  Result Date: 02/07/2021 CLINICAL DATA:  Pleuritic left chest pain and shortness of breath with hypoxia. Abdominal pain. Malaise. COVID positive today in the emergency department. EXAM: CT ANGIOGRAPHY CHEST CT ABDOMEN AND PELVIS WITH CONTRAST TECHNIQUE: Multidetector CT imaging of the chest was performed using the standard protocol during bolus administration of intravenous contrast. Multiplanar CT image reconstructions and MIPs were obtained to evaluate the vascular anatomy. Multidetector CT imaging of the abdomen and pelvis was performed using the standard protocol during bolus administration of intravenous contrast. CONTRAST:  129m OMNIPAQUE IOHEXOL 350 MG/ML SOLN COMPARISON:  CT abdomen 12/27/2019. FINDINGS: CTA CHEST FINDINGS Cardiovascular: Subsegmental filling defect in the right lower lobe pulmonary artery posterior basal region favors chronic pulmonary embolus although a small acute embolic component cannot be totally excluded. This is subsegmental and subtle and no other findings of pulmonary embolus are identified in the lungs. These findings are shown on images 51 through 61 of series 3 in the corresponding thin-section images. Coronary, aortic arch, and branch vessel atherosclerotic vascular disease. Right axillary vein stent. Moderate cardiomegaly with coronary artery atherosclerotic vascular calcification. Prominent hepatic vein likely from poor right heart function. Mediastinum/Nodes: AP window lymph node 0.8 cm in short axis, within normal limits. No pathologic adenopathy identified. Lungs/Pleura: Small right and trace left pleural effusions. Hazy airspace opacity in the posterior basal segments of both lungs favoring atelectasis over pneumonia. Currently we do not show the peripheral ground-glass opacity scattered in the lungs characteristic of COVID pneumonia. Musculoskeletal: There are acute fractures of the left seventh, eighth, and  ninth ribs posterolaterally. 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 Bridging spurring anteriorly through most of the thoracic spine. Chronic anterior wedging at T12. Review of the MIP images confirms the above findings. CT ABDOMEN and PELVIS FINDINGS Hepatobiliary: Faint heterogeneity in the liver potentially from hepatic congestion no compelling morphology of cirrhosis. Correlate with liver enzymes and potentially hepatitis panel given the heterogeneous enhancement in the liver. High density in the indistinct and blurred gallbladder, possibly from gallstones. No biliary dilatation. Pancreas: Unremarkable Spleen: Unremarkable Adrenals/Urinary Tract: Both adrenal glands appear normal. Severe bilateral renal atrophy. Severely atrophic transplant kidney in the right  iliac fossa. Stomach/Bowel: Unremarkable Vascular/Lymphatic: Prominent atherosclerotic vascular calcification. No pathologic adenopathy identified. Reproductive: Unremarkable Other: Small amount of ascites. Mild mesenteric edema and mild subcutaneous edema. 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 Review of the MIP images confirms the above findings. IMPRESSION: 1. Questionable subsegmental filling defect in a branch of the right lower lobe posterior basal segment pulmonary artery. This could well be from chronic pulmonary embolus given the somewhat peripheral location, but subsegmental acute pulmonary embolus is difficult to completely exclude. No segmental or lobar pulmonary embolus is identified. 2. Moderate cardiomegaly with extensive atherosclerosis throughout the chest, abdomen, and pelvis. 3. 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 4. Small right and trace left pleural effusions. Minimal bibasilar atelectasis. 5. Acute fractures of the left seventh, eighth, and ninth ribs  posterolaterally. 6. 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 7. 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 8. Small amount of ascites with diffuse mesenteric and subcutaneous edema compatible with third spacing of fluid. Electronically Signed   By: Van Clines M.D.   On: 02/07/2021 21:12   DG Chest Portable 1 View  Result Date: 02/07/2021 CLINICAL DATA:  Left-sided pleuritic chest pain, hypoxia, short of breath EXAM: PORTABLE CHEST 1 VIEW COMPARISON:  04/22/2020 FINDINGS: Single frontal view of the chest demonstrates stable enlargement of the cardiac silhouette. Increased density in the retrocardiac region may reflect left lower lobe airspace disease or atelectasis. Small left pleural effusion versus pleural thickening obscures the lateral costophrenic angle. The right chest is clear. No pneumothorax. Stable vascular stent right subclavian region. No acute bony abnormalities. IMPRESSION: 1. Retrocardiac left lower lobe consolidation and small left pleural effusion. Findings are consistent with left lower lobe pneumonia. Electronically Signed   By: Randa Ngo M.D.   On: 02/07/2021 18:30    Procedures Procedures   CRITICAL CARE Performed by: Gwenyth Allegra Kimberley [place] Total critical care time: 50 minutes Critical care time was exclusive of separately billable procedures and treating other patients. Critical care was necessary to treat or prevent imminent or life-threatening deterioration. Critical care was time spent personally by me on the following activities: development of treatment plan with patient and/or surrogate as well as nursing, discussions with consultants, evaluation of patient's response to treatment, examination of patient, obtaining history from patient or surrogate, ordering and performing treatments and interventions, ordering and review of laboratory studies, ordering and review of radiographic studies, pulse oximetry and  re-evaluation of patient's condition.   Medications Ordered in ED Medications  0.9 %  sodium chloride infusion ( Intravenous Stopped 02/07/21 2257)  0.9 %  sodium chloride infusion ( Intravenous Stopped 02/07/21 2257)  dexamethasone (DECADRON) injection 10 mg (has no administration in time range)  albuterol (VENTOLIN HFA) 108 (90 Base) MCG/ACT inhaler 2 puff (2 puffs Inhalation Given 02/07/21 1839)  ceFEPIme (MAXIPIME) 2 g in sodium chloride 0.9 % 100 mL IVPB (0 g Intravenous Stopped 02/07/21 2257)  doxycycline (VIBRAMYCIN) 100 mg in sodium chloride 0.9 % 250 mL IVPB (0 mg Intravenous Stopped 02/07/21 2257)  iohexol (OMNIPAQUE) 350 MG/ML injection 100 mL (100 mLs Intravenous Contrast Given 02/07/21 1950)    ED Course  I have reviewed the triage vital signs and the nursing notes.  Pertinent labs & imaging results that were available during my care of the patient were reviewed by me and considered in my medical decision making (see  chart for details).    MDM Rules/Calculators/A&P                          Mathew Pierce is a 76 y.o. 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  Patient reports that he had dialysis today and completed his treatment.  He says that since yesterday, he has had severe pain in his left lower chest and left upper abdomen that is very pleuritic.  He describes it as 9 out of 10.  It is worse with deep breathing and coughing.  He has reported a productive cough for the last few days but denies fevers or chills.  Of note, on arrival, patient was hypoxic with oxygen saturation 81 on room air and he does not take oxygen at home.  Patient denies history of DVT or PE.  He is reporting he feels sleepy and denies making any urine.  Denies any constipation or diarrhea or trauma.  Denies other complaints.  On exam, lungs are  coarse bilaterally.  Chest is extremely tender on the left side.  Left abdomen is also tender to palpation.  Bowel sounds were appreciated.  Intact sensation and strength in extremities.  He reports chronic tenderness in the left leg.  Good pulses in extremities.   On 5 L nasal cannula, oxygen saturations are now in the low 90s.  He is starting to wake up more.  He is still somewhat somnolent and difficult to arouse.  We will get an ABG to look for retaining CO2.  Clinically I am concerned for several etiologies.  With his extremely pleuritic chest pain, hypoxia, I do feel need to rule out PE.  I discussed the patient that we typically try to avoid contrast in people who are on dialysis and have kidney transplant but he has had CT scan last year with contrast and he agrees that he needs the contrast today.  We will get a CT abdomen pelvis as well to look for diverticulitis or other intra-abdominal cause of the pain as well as the PE study.  We will get x-ray to make sure there is no pneumothorax or other abnormality that needs intervention initially.  We will get screening labs and checking for COVID.    EKG does not show STEMI but does show potentially new atrial fibrillation.  Due to his new hypoxia and somnolence, anticipate admission.  Anticipate reassessment after work-up is completed.  10:15 PM Work-up began to return and he has multiple things wrong.  He does have COVID, he has evidence of 3 new rib fractures, pneumonia, and his CT scan shows possible pulmonary embolism.  I asked the patient and he says he did fall after dialysis yesterday and likely broke the ribs.  This is likely explains some of the pain.  The CT scan shows possible subsegmental PE but patient reports that he is not on blood thinners during dialysis due to previous GI bleeding.  Will discuss with admitting team heparin versus not given the somewhat inconclusive read about pulmonary embolism.  Troponin is elevated and slightly  rising, likely demand ischemia in the setting of the pneumonia and his dialysis and chest injury.  He will need dialysis during his admission, nephrology team was called earlier during his emergency department evaluation when he had the concerning lab findings and  they will see him after admission.  Patient will be admitted for further management.  11:16 PM Admitting team requested initiation of heparin for new A. fib and likely PE, remdesivir for COVID,  Decadron for COVID, requested procalcitonin, D-dimer, and CRP for further management.  They will admit to progressive bed for further management.   Final Clinical Impression(s) / ED Diagnoses Final diagnoses:  Hypoxia  Closed fracture of multiple ribs of left side, initial encounter  Community acquired pneumonia, unspecified laterality  COVID-19  Acute pulmonary embolism, unspecified pulmonary embolism type, unspecified whether acute cor pulmonale present (HCC)  Elevated troponin     Clinical Impression: 1. Hypoxia   2. Closed fracture of multiple ribs of left side, initial encounter   3. Community acquired pneumonia, unspecified laterality   4. COVID-19   5. Acute pulmonary embolism, unspecified pulmonary embolism type, unspecified whether acute cor pulmonale present (HCC)   6. Elevated troponin     Disposition: Admit  This note was prepared with assistance of Dragon voice recognition software. Occasional wrong-word or sound-a-like substitutions may have occurred due to the inherent limitations of voice recognition software.     Mathew Pierce, Gwenyth Allegra, MD 02/07/21 [phone]

## 2021-02-08 ENCOUNTER — Inpatient Hospital Stay (HOSPITAL_COMMUNITY): Payer: Medicare HMO

## 2021-02-08 ENCOUNTER — Encounter (HOSPITAL_COMMUNITY): Payer: Self-pay | Admitting: Internal Medicine

## 2021-02-08 ENCOUNTER — Other Ambulatory Visit: Payer: Self-pay | Admitting: Family Medicine

## 2021-02-08 DIAGNOSIS — D696 Thrombocytopenia, unspecified: Secondary | ICD-10-CM

## 2021-02-08 DIAGNOSIS — Z992 Dependence on renal dialysis: Secondary | ICD-10-CM | POA: Diagnosis not present

## 2021-02-08 DIAGNOSIS — J9601 Acute respiratory failure with hypoxia: Secondary | ICD-10-CM | POA: Diagnosis present

## 2021-02-08 DIAGNOSIS — I4891 Unspecified atrial fibrillation: Secondary | ICD-10-CM | POA: Diagnosis not present

## 2021-02-08 DIAGNOSIS — I2699 Other pulmonary embolism without acute cor pulmonale: Secondary | ICD-10-CM | POA: Diagnosis not present

## 2021-02-08 DIAGNOSIS — A4189 Other specified sepsis: Secondary | ICD-10-CM | POA: Diagnosis present

## 2021-02-08 DIAGNOSIS — S2249XA Multiple fractures of ribs, unspecified side, initial encounter for closed fracture: Secondary | ICD-10-CM | POA: Diagnosis present

## 2021-02-08 DIAGNOSIS — Y929 Unspecified place or not applicable: Secondary | ICD-10-CM | POA: Diagnosis not present

## 2021-02-08 DIAGNOSIS — E119 Type 2 diabetes mellitus without complications: Secondary | ICD-10-CM | POA: Diagnosis not present

## 2021-02-08 DIAGNOSIS — T8612 Kidney transplant failure: Secondary | ICD-10-CM | POA: Diagnosis present

## 2021-02-08 DIAGNOSIS — I132 Hypertensive heart and chronic kidney disease with heart failure and with stage 5 chronic kidney disease, or end stage renal disease: Secondary | ICD-10-CM | POA: Diagnosis present

## 2021-02-08 DIAGNOSIS — R0902 Hypoxemia: Secondary | ICD-10-CM | POA: Diagnosis present

## 2021-02-08 DIAGNOSIS — J440 Chronic obstructive pulmonary disease with acute lower respiratory infection: Secondary | ICD-10-CM | POA: Diagnosis present

## 2021-02-08 DIAGNOSIS — D638 Anemia in other chronic diseases classified elsewhere: Secondary | ICD-10-CM | POA: Diagnosis present

## 2021-02-08 DIAGNOSIS — I5022 Chronic systolic (congestive) heart failure: Secondary | ICD-10-CM | POA: Diagnosis present

## 2021-02-08 DIAGNOSIS — Z23 Encounter for immunization: Secondary | ICD-10-CM | POA: Diagnosis not present

## 2021-02-08 DIAGNOSIS — Y838 Other surgical procedures as the cause of abnormal reaction of the patient, or of later complication, without mention of misadventure at the time of the procedure: Secondary | ICD-10-CM | POA: Diagnosis present

## 2021-02-08 DIAGNOSIS — I5082 Biventricular heart failure: Secondary | ICD-10-CM | POA: Diagnosis present

## 2021-02-08 DIAGNOSIS — D72819 Decreased white blood cell count, unspecified: Secondary | ICD-10-CM | POA: Diagnosis present

## 2021-02-08 DIAGNOSIS — I248 Other forms of acute ischemic heart disease: Secondary | ICD-10-CM | POA: Diagnosis present

## 2021-02-08 DIAGNOSIS — I10 Essential (primary) hypertension: Secondary | ICD-10-CM

## 2021-02-08 DIAGNOSIS — N186 End stage renal disease: Secondary | ICD-10-CM

## 2021-02-08 DIAGNOSIS — J1282 Pneumonia due to coronavirus disease 2019: Secondary | ICD-10-CM | POA: Diagnosis present

## 2021-02-08 DIAGNOSIS — R6520 Severe sepsis without septic shock: Secondary | ICD-10-CM

## 2021-02-08 DIAGNOSIS — J159 Unspecified bacterial pneumonia: Secondary | ICD-10-CM | POA: Diagnosis present

## 2021-02-08 DIAGNOSIS — I2510 Atherosclerotic heart disease of native coronary artery without angina pectoris: Secondary | ICD-10-CM | POA: Diagnosis present

## 2021-02-08 DIAGNOSIS — E1122 Type 2 diabetes mellitus with diabetic chronic kidney disease: Secondary | ICD-10-CM | POA: Diagnosis present

## 2021-02-08 DIAGNOSIS — A419 Sepsis, unspecified organism: Secondary | ICD-10-CM

## 2021-02-08 DIAGNOSIS — F32A Depression, unspecified: Secondary | ICD-10-CM | POA: Diagnosis present

## 2021-02-08 DIAGNOSIS — I255 Ischemic cardiomyopathy: Secondary | ICD-10-CM | POA: Diagnosis present

## 2021-02-08 DIAGNOSIS — I472 Ventricular tachycardia: Secondary | ICD-10-CM | POA: Diagnosis present

## 2021-02-08 DIAGNOSIS — U071 COVID-19: Secondary | ICD-10-CM

## 2021-02-08 DIAGNOSIS — S2242XA Multiple fractures of ribs, left side, initial encounter for closed fracture: Secondary | ICD-10-CM | POA: Diagnosis present

## 2021-02-08 DIAGNOSIS — D72810 Lymphocytopenia: Secondary | ICD-10-CM

## 2021-02-08 LAB — CBC WITH DIFFERENTIAL/PLATELET
Abs Immature Granulocytes: 0.01 10*3/uL (ref 0.00–0.07)
Basophils Absolute: 0 10*3/uL (ref 0.0–0.1)
Basophils Relative: 0 %
Eosinophils Absolute: 0 10*3/uL (ref 0.0–0.5)
Eosinophils Relative: 0 %
HCT: 34.8 % — ABNORMAL LOW (ref 39.0–52.0)
Hemoglobin: 11.5 g/dL — ABNORMAL LOW (ref 13.0–17.0)
Immature Granulocytes: 0 %
Lymphocytes Relative: 10 %
Lymphs Abs: 0.3 10*3/uL — ABNORMAL LOW (ref 0.7–4.0)
MCH: 32 pg (ref 26.0–34.0)
MCHC: 33 g/dL (ref 30.0–36.0)
MCV: 96.9 fL (ref 80.0–100.0)
Monocytes Absolute: 0.1 10*3/uL (ref 0.1–1.0)
Monocytes Relative: 5 %
Neutro Abs: 2.3 10*3/uL (ref 1.7–7.7)
Neutrophils Relative %: 85 %
Platelets: 127 10*3/uL — ABNORMAL LOW (ref 150–400)
RBC: 3.59 MIL/uL — ABNORMAL LOW (ref 4.22–5.81)
RDW: 16.2 % — ABNORMAL HIGH (ref 11.5–15.5)
WBC: 2.7 10*3/uL — ABNORMAL LOW (ref 4.0–10.5)
nRBC: 0 % (ref 0.0–0.2)

## 2021-02-08 LAB — COMPREHENSIVE METABOLIC PANEL
ALT: 14 U/L (ref 0–44)
AST: 23 U/L (ref 15–41)
Albumin: 2.7 g/dL — ABNORMAL LOW (ref 3.5–5.0)
Alkaline Phosphatase: 116 U/L (ref 38–126)
Anion gap: 12 (ref 5–15)
BUN: 24 mg/dL — ABNORMAL HIGH (ref 8–23)
CO2: 24 mmol/L (ref 22–32)
Calcium: 9.4 mg/dL (ref 8.9–10.3)
Chloride: 97 mmol/L — ABNORMAL LOW (ref 98–111)
Creatinine, Ser: 7.13 mg/dL — ABNORMAL HIGH (ref 0.61–1.24)
GFR, Estimated: 7 mL/min — ABNORMAL LOW (ref >60)
Glucose, Bld: 133 mg/dL — ABNORMAL HIGH (ref 70–99)
Potassium: 4.5 mmol/L (ref 3.5–5.1)
Sodium: 133 mmol/L — ABNORMAL LOW (ref 135–145)
Total Bilirubin: 1.6 mg/dL — ABNORMAL HIGH (ref 0.3–1.2)
Total Protein: 7.1 g/dL (ref 6.5–8.1)

## 2021-02-08 LAB — ECHOCARDIOGRAM LIMITED
AR max vel: 3.28 cm2
AV Area VTI: 3.3 cm2
AV Area mean vel: 2.85 cm2
AV Mean grad: 6 mmHg
AV Peak grad: 9.6 mmHg
Ao pk vel: 1.55 m/s
Height: 67 in
S' Lateral: 5.5 cm
Weight: 2800.72 oz

## 2021-02-08 LAB — GLUCOSE, CAPILLARY
Glucose-Capillary: 145 mg/dL — ABNORMAL HIGH (ref 70–99)
Glucose-Capillary: 135 mg/dL — ABNORMAL HIGH (ref 70–99)
Glucose-Capillary: 128 mg/dL — ABNORMAL HIGH (ref 70–99)
Glucose-Capillary: 161 mg/dL — ABNORMAL HIGH (ref 70–99)
Glucose-Capillary: 163 mg/dL — ABNORMAL HIGH (ref 70–99)
Glucose-Capillary: 191 mg/dL — ABNORMAL HIGH (ref 70–99)

## 2021-02-08 LAB — PROCALCITONIN
Procalcitonin: 1.39 ng/mL
Procalcitonin: 1.74 ng/mL

## 2021-02-08 LAB — HEPARIN LEVEL (UNFRACTIONATED)
Heparin Unfractionated: 0.15 IU/mL — ABNORMAL LOW (ref 0.30–0.70)
Heparin Unfractionated: 0.32 IU/mL (ref 0.30–0.70)

## 2021-02-08 LAB — MRSA PCR SCREENING: MRSA by PCR: NEGATIVE

## 2021-02-08 LAB — TSH: TSH: 0.639 u[IU]/mL (ref 0.350–4.500)

## 2021-02-08 LAB — D-DIMER, QUANTITATIVE: D-Dimer, Quant: 2.49 ug/mL-FEU — ABNORMAL HIGH (ref 0.00–0.50)

## 2021-02-08 LAB — C-REACTIVE PROTEIN: CRP: 5 mg/dL — ABNORMAL HIGH (ref <1.0)

## 2021-02-08 MED ORDER — ACETAMINOPHEN 325 MG PO TABS
650.0000 mg | ORAL_TABLET | Freq: Four times a day (QID) | ORAL | Status: DC | PRN
  Administered 2021-02-09: 650 mg via ORAL
  Filled 2021-02-08: qty 2

## 2021-02-08 MED ORDER — GUAIFENESIN-DM 100-10 MG/5ML PO SYRP: 10.0000 mL | ORAL_SOLUTION | ORAL | Status: DC | PRN

## 2021-02-08 MED ORDER — HYDROCODONE-ACETAMINOPHEN 5-325 MG PO TABS
1.0000 | ORAL_TABLET | ORAL | Status: DC | PRN
  Administered 2021-02-08 – 2021-02-10 (×4): 1 via ORAL
  Administered 2021-02-11 (×2): 2 via ORAL
  Filled 2021-02-08 (×3): qty 1
  Filled 2021-02-08 (×2): qty 2
  Filled 2021-02-08: qty 1

## 2021-02-08 MED ORDER — ALBUTEROL SULFATE HFA 108 (90 BASE) MCG/ACT IN AERS
2.0000 | INHALATION_SPRAY | Freq: Four times a day (QID) | RESPIRATORY_TRACT | Status: DC
  Administered 2021-02-09 – 2021-02-12 (×7): 2 via RESPIRATORY_TRACT
  Filled 2021-02-08: qty 6.7

## 2021-02-08 MED ORDER — SODIUM CHLORIDE 0.9 % IV SOLN
100.0000 mg | Freq: Two times a day (BID) | INTRAVENOUS | Status: DC
  Administered 2021-02-08 – 2021-02-09 (×4): 100 mg via INTRAVENOUS
  Filled 2021-02-08 (×6): qty 100

## 2021-02-08 MED ORDER — LINAGLIPTIN 5 MG PO TABS
5.0000 mg | ORAL_TABLET | Freq: Every day | ORAL | Status: DC
  Administered 2021-02-08 – 2021-02-12 (×5): 5 mg via ORAL
  Filled 2021-02-08 (×5): qty 1

## 2021-02-08 MED ORDER — CHLORHEXIDINE GLUCONATE CLOTH 2 % EX PADS
6.0000 | MEDICATED_PAD | Freq: Every day | CUTANEOUS | Status: DC
  Administered 2021-02-10 – 2021-02-12 (×2): 6 via TOPICAL

## 2021-02-08 MED ORDER — INSULIN DETEMIR 100 UNIT/ML ~~LOC~~ SOLN
0.1000 [IU]/kg | Freq: Two times a day (BID) | SUBCUTANEOUS | Status: DC
  Administered 2021-02-08 – 2021-02-12 (×9): 8 [IU] via SUBCUTANEOUS
  Filled 2021-02-08 (×10): qty 0.08

## 2021-02-08 MED ORDER — INSULIN ASPART 100 UNIT/ML IJ SOLN
0.0000 [IU] | INTRAMUSCULAR | Status: DC
  Administered 2021-02-08: 2 [IU] via SUBCUTANEOUS
  Administered 2021-02-08 (×2): 1 [IU] via SUBCUTANEOUS
  Administered 2021-02-08 (×2): 2 [IU] via SUBCUTANEOUS
  Administered 2021-02-09: 1 [IU] via SUBCUTANEOUS
  Administered 2021-02-09: 2 [IU] via SUBCUTANEOUS
  Administered 2021-02-09 – 2021-02-10 (×4): 1 [IU] via SUBCUTANEOUS
  Administered 2021-02-10: 2 [IU] via SUBCUTANEOUS
  Administered 2021-02-10 (×2): 1 [IU] via SUBCUTANEOUS
  Administered 2021-02-10: 2 [IU] via SUBCUTANEOUS
  Administered 2021-02-11: 1 [IU] via SUBCUTANEOUS
  Administered 2021-02-11 (×2): 2 [IU] via SUBCUTANEOUS
  Administered 2021-02-11: 1 [IU] via SUBCUTANEOUS
  Administered 2021-02-11: 2 [IU] via SUBCUTANEOUS

## 2021-02-08 MED ORDER — SODIUM CHLORIDE 0.9 % IV SOLN
2.0000 g | INTRAVENOUS | Status: DC
  Administered 2021-02-08 – 2021-02-11 (×4): 2 g via INTRAVENOUS
  Filled 2021-02-08 (×4): qty 20

## 2021-02-08 MED ORDER — ZINC SULFATE 220 (50 ZN) MG PO CAPS
220.0000 mg | ORAL_CAPSULE | Freq: Every day | ORAL | Status: DC
  Administered 2021-02-08 – 2021-02-12 (×5): 220 mg via ORAL
  Filled 2021-02-08 (×5): qty 1

## 2021-02-08 MED ORDER — PROCHLORPERAZINE EDISYLATE 10 MG/2ML IJ SOLN
5.0000 mg | INTRAMUSCULAR | Status: DC | PRN
  Filled 2021-02-08: qty 1

## 2021-02-08 MED ORDER — ASCORBIC ACID 500 MG PO TABS
500.0000 mg | ORAL_TABLET | Freq: Every day | ORAL | Status: DC
  Administered 2021-02-08 – 2021-02-12 (×5): 500 mg via ORAL
  Filled 2021-02-08 (×5): qty 1

## 2021-02-08 MED ORDER — PREDNISONE 50 MG PO TABS
50.0000 mg | ORAL_TABLET | Freq: Every day | ORAL | Status: DC
  Administered 2021-02-11 – 2021-02-12 (×2): 50 mg via ORAL
  Filled 2021-02-08 (×2): qty 1

## 2021-02-08 MED ORDER — ONDANSETRON HCL 4 MG/2ML IJ SOLN
4.0000 mg | Freq: Four times a day (QID) | INTRAMUSCULAR | Status: DC | PRN
  Administered 2021-02-10: 4 mg via INTRAVENOUS
  Filled 2021-02-08: qty 2

## 2021-02-08 MED ORDER — METHYLPREDNISOLONE SODIUM SUCC 40 MG IJ SOLR
40.0000 mg | Freq: Two times a day (BID) | INTRAMUSCULAR | Status: AC
  Administered 2021-02-08 – 2021-02-10 (×6): 40 mg via INTRAVENOUS
  Filled 2021-02-08 (×6): qty 1

## 2021-02-08 MED ORDER — HYDROCOD POLST-CPM POLST ER 10-8 MG/5ML PO SUER
5.0000 mL | Freq: Two times a day (BID) | ORAL | Status: DC | PRN
  Filled 2021-02-08: qty 5

## 2021-02-08 MED ORDER — SODIUM CHLORIDE 0.9 % IV SOLN: 100.0000 mg | Freq: Every day | INTRAVENOUS | Status: DC

## 2021-02-08 MED ORDER — ONDANSETRON HCL 4 MG PO TABS: 4.0000 mg | ORAL_TABLET | Freq: Four times a day (QID) | ORAL | Status: DC | PRN

## 2021-02-08 MED ORDER — LIDOCAINE 5 % EX PTCH
1.0000 | MEDICATED_PATCH | CUTANEOUS | Status: DC
  Administered 2021-02-08 – 2021-02-12 (×5): 1 via TRANSDERMAL
  Filled 2021-02-08 (×5): qty 1

## 2021-02-08 MED ORDER — HEPARIN BOLUS VIA INFUSION
2300.0000 [IU] | Freq: Once | INTRAVENOUS | Status: AC
  Administered 2021-02-08: 2300 [IU] via INTRAVENOUS
  Filled 2021-02-08: qty 2300

## 2021-02-08 MED ORDER — SODIUM CHLORIDE 0.9 % IV SOLN: 200.0000 mg | Freq: Once | INTRAVENOUS | Status: DC

## 2021-02-08 MED ORDER — DEXAMETHASONE SODIUM PHOSPHATE 10 MG/ML IJ SOLN
6.0000 mg | INTRAMUSCULAR | Status: DC
  Administered 2021-02-08: 6 mg via INTRAVENOUS
  Filled 2021-02-08: qty 1

## 2021-02-08 MED ORDER — PNEUMOCOCCAL VAC POLYVALENT 25 MCG/0.5ML IJ INJ
0.5000 mL | INJECTION | INTRAMUSCULAR | Status: DC
  Filled 2021-02-08: qty 0.5

## 2021-02-08 NOTE — Progress Notes (Signed)
Outpatient HD clinic Charge RN/Marie called Navigator back and states patient's COVID shift at discharge will be MWF at 11:00am. She states patient's family typically drives him and that they need to be informed that they need to call when they arrive to the parking lot and need to bring him to the side door, left of the building.  Navigator will follow up with patient/family and has notified inpt Nephrologist/Dr. Joelyn Oms of new outpatient schedule (now MWF as opposed to TTS).   Mathew Pierce, [place] [facility] Navigator [phone]

## 2021-02-08 NOTE — Progress Notes (Signed)
ANTICOAGULATION CONSULT NOTE - Follow Up Consult  Pharmacy Consult for IV Heparin  Indication: PE and new afib  No Known Allergies  Patient Measurements: Height: '5\' 7"'$  (170.2 cm) Weight: 79.4 kg (175 lb 0.7 oz) IBW/kg (Calculated) : 66.1 Heparin Dosing Weight:  79.4 kg  Vital Signs: Temp: 98.3 F (36.8 C) (05/04 1221) Temp Source: Oral (05/04 1221) BP: 138/78 (05/04 1221) Pulse Rate: 74 (05/04 1221)  Labs: Recent Labs    02/07/21 1711 02/07/21 1733 02/07/21 1756 02/07/21 2018 02/08/21 0641 02/08/21 1610  HGB 12.6* 12.9*  --   --  11.5*  --   HCT 38.4* 38.0*  --   --  34.8*  --   PLT 125*  --   --   --  127*  --   HEPARINUNFRC  --   --   --   --  0.15* 0.32  CREATININE  --   --  5.74*  --  7.13*  --   TROPONINIHS 253*  --   --  298*  --   --     Estimated Creatinine Clearance: 9 mL/min (A) (by C-G formula based on SCr of 7.13 mg/dL (H)).  Assessment: Anticoag: Possible PE + new afib, COVID+. Pt with ESRD, on HD; he reports that he does not get anticoagulation during HD due to GIB (?), so will dose heparin cautiously; D-dimer 2.49; Hgb down to 11.5 today; plt 127 (stable low, chronic).   Heparin level ~7.5 hrs after heparin 2300 units IV bolus X 1, followed by increasing heparin infusion to 1200 units/hr, was 0.32 units/ml, which is at the low end of the goal range for this pt. Per RN, no issues with IV or bleeding observed.  Goal of Therapy:  Heparin level 0.3-0.7 units/ml Monitor platelets by anticoagulation protocol: Yes   Plan:  Increase heparin infusion slightly to 1250 units/hr Recheck heparin level in 8 hrs Monitor daily heparin level, CBC Monitor for bleeding F/U transition to oral anticoagulant when able F/U on information pertaining to hx of GIB?  Gillermina Hu, PharmD, BCPS, [facility] Clinical Pharmacist 02/08/2021,6:16 PM

## 2021-02-08 NOTE — Progress Notes (Signed)
  Echocardiogram 2D Echocardiogram has been performed.  Mathew Pierce 02/08/2021, 4:20 PM

## 2021-02-08 NOTE — Progress Notes (Signed)
Renal Navigator notes HD patient admission who is positive for COVID. Navigator updated patient's outpatient HD clinic/[facility] and asked that they make a plan for his dialysis at discharge. Staff report that Agricultural consultant will call Navigator back. Navigator faxed ED note and positive COVID lab result to clinic to provide continuity of care. Navigator also requested outpatient HD orders be faxed to inpatient HD unit.   Alphonzo Cruise, [place] Renal Navigator [phone]

## 2021-02-08 NOTE — Progress Notes (Signed)
ANTICOAGULATION CONSULT NOTE - Follow Up Consult  Pharmacy Consult for Heparin  Indication: PE and new afib  No Known Allergies  Patient Measurements: Height: '5\' 7"'$  (170.2 cm) Weight: 79.4 kg (175 lb 0.7 oz) IBW/kg (Calculated) : 66.1 Heparin Dosing Weight:  79.4 kg  Vital Signs: Temp: 98.1 F (36.7 C) (05/04 0400) Temp Source: Axillary (05/04 0400) BP: 155/64 (05/04 0400) Pulse Rate: 82 (05/04 0400)  Labs: Recent Labs    02/07/21 1711 02/07/21 1733 02/07/21 1756 02/07/21 2018 02/08/21 0641  HGB 12.6* 12.9*  --   --  11.5*  HCT 38.4* 38.0*  --   --  34.8*  PLT 125*  --   --   --  127*  HEPARINUNFRC  --   --   --   --  0.15*  CREATININE  --   --  5.74*  --   --   TROPONINIHS 253*  --   --  298*  --     Estimated Creatinine Clearance: 11.2 mL/min (A) (by C-G formula based on SCr of 5.74 mg/dL (H)).  Assessment: Anticoag: Possible PE + new afib.Marland Kitchen Pt reports that he does not get AC during HD d/t GIB (?) so will dose heparin cautiously. Ddimer 2.49 - Hep level 0.15, Hgb down to 11.5. Plts 127 low stable.  Goal of Therapy:  Heparin level 0.3-0.7 units/ml Monitor platelets by anticoagulation protocol: Yes   Plan:  Need to f/u on information pertaining to GIB (Hgb 12.6 on admit) IV heparin bolus 2300 units Increase IV heparin 1200 units/hr Recheck heparin level in 8 hrs. Daily HL and CBC   Keven Osborn S. Alford Highland, PharmD, BCPS Clinical Staff Pharmacist [email or web address] Alford Highland, [employer] 02/08/2021,7:58 AM
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
## 2021-02-08 NOTE — Consult Note (Signed)
Mathew Pierce Admit Date: 02/07/2021 02/08/2021 [name] Requesting Physician: Fuller Plan   Reason for Consult:    HPI:   Mathew Pierce is a 75 y.o. male with medical history significant of hypertension, DM,  ESRD on HD, s/p kidney transplant presents with left sided pleuritc chest pain, productive cough, abdominal pain  ED course: Afebrile, HR 62-98, RR 16-27, BP 96/71-155/64, sats 81% on RA. Labs 5/3: WBC 4.8, Hb 12.6, platelets 125, sodium 134, potassium 4.2, BUN 19, creatinine 5.74, albumin 133, ammonia level 37, total bilirubin 1.7, high-sensitivity troponin 253-> 298, CRP 5, procalc 1.39, D-dimer 2.49, lactic acid 2.1.  COVID-19 screening was positive.  CXR: left lower lobe pneumonia with small effusion. CT angio chest, abdomen and pelvis: right sided PE,  acute left rib fractures 7-9 posterior laterally, concern for hepatic congestion, cardiomegaly with extensive atherosclerosis, and small bilateral pleural effusions R>L. Patient has received doxycycline, cefepime, Decadron 6 mg IV, remdesivir, and started on heparin drip.  PMH Incudes:  Closed fracture of multiple ribs of left side, initial encounter   CAP  COVID-19   Acute PE   Elevated troponin   Creatinine, Ser (mg/dL)  Date Value  02/08/2021 7.13 (H)  02/07/2021 5.74 (H)  04/24/2020 6.38 (H)  12/29/2019 11.21 (H)  12/28/2019 9.55 (H)  12/27/2019 8.39 (H)   I/Os: 374 in, no output documented  ROS NSAIDS: no IV Contrast yes TMP/SMX no Hypotension no Balance of 12 systems is negative w/ exceptions as above  PMH  Past Medical History:  Diagnosis Date  . Diabetes mellitus without complication ([place])   . Dialysis patient ([place])   . Hypertension   . Renal disorder   . Renal insufficiency    PSH  Past Surgical History:  Procedure Laterality Date  . AV FISTULA PLACEMENT    . KIDNEY TRANSPLANT     FH History reviewed. No pertinent family history. SH  reports that he has never smoked. He has never used  smokeless tobacco. He reports that he does not drink alcohol and does not use drugs. Allergies No Known Allergies Home medications Prior to Admission medications   Medication Sig Start Date End Date Taking? Authorizing Provider  acetaminophen (TYLENOL) 500 MG tablet Take 2 tablets (1,000 mg total) by mouth every 6 (six) hours as needed. Patient taking differently: Take 1,000 mg by mouth every 6 (six) hours as needed for mild pain. 12/29/19  Yes Saverio Danker, PA-C  amLODipine (NORVASC) 10 MG tablet Take 10 mg by mouth daily. 12/11/19  Yes [provider]  aspirin EC 81 MG tablet Take 81 mg by mouth daily.   Yes [provider]  atorvastatin (LIPITOR) 40 MG tablet Take 40 mg by mouth daily at 6 PM.  12/11/19  Yes [provider]  carvedilol (COREG) 6.25 MG tablet Take 6.25 mg by mouth 2 (two) times daily. 12/11/19  Yes [provider]  CELLCEPT 250 MG capsule Take 250 mg by mouth 2 (two) times daily. 11/13/19   [provider]  clopidogrel (PLAVIX) 75 MG tablet Take 75 tablets by mouth daily. 11/09/20   [provider]  folic acid (FOLVITE) 1 MG tablet Take 1 mg by mouth daily. 12/11/19   [provider]  insulin detemir (LEVEMIR) 100 UNIT/ML injection Inject 20 Units into the skin 2 (two) times daily.    [provider]  magnesium oxide (MAG-OX) 400 MG tablet Take 400 mg by mouth daily.  12/11/19   [provider]  pantoprazole ([place])  40 MG tablet Take 40 mg by mouth 2 (two) times daily. 12/11/19   [provider]  predniSONE (DELTASONE) 5 MG tablet Take 5 mg by mouth daily. 12/11/19   [provider]  PROGRAF 1 MG capsule Take 2 mg by mouth 2 (two) times daily. 12/11/19   [provider]    Current Medications Scheduled Meds: . albuterol  2 puff Inhalation Q6H  . vitamin C  500 mg Oral Daily  . insulin aspart  0-9 Units Subcutaneous Q4H  . insulin detemir  0.1 Units/kg Subcutaneous BID  . lidocaine   1 patch Transdermal Q24H  . linagliptin  5 mg Oral Daily  . methylPREDNISolone (SOLU-MEDROL) injection  40 mg Intravenous Q12H   Followed by  . [START ON 02/11/2021] predniSONE  50 mg Oral Daily  . [START ON 02/09/2021] pneumococcal 23 valent vaccine  0.5 mL Intramuscular Tomorrow-1000  . zinc sulfate  220 mg Oral Daily   Continuous Infusions: . sodium chloride Stopped (02/07/21 2257)  . sodium chloride Stopped (02/07/21 2257)  . cefTRIAXone (ROCEPHIN)  IV    . doxycycline (VIBRAMYCIN) IV    . heparin 1,200 Units/hr (02/08/21 0836)  . remdesivir 100 mg in NS 100 mL     PRN Meds:.sodium chloride, sodium chloride, acetaminophen, chlorpheniramine-HYDROcodone, guaiFENesin-dextromethorphan, HYDROcodone-acetaminophen, ondansetron **OR** ondansetron (ZOFRAN) IV, prochlorperazine  CBC Recent Labs  Lab 02/07/21 1711 02/07/21 1733 02/08/21 0641  WBC 4.8  --  2.7*  NEUTROABS 3.0  --  2.3  HGB 12.6* 12.9* 11.5*  HCT 38.4* 38.0* 34.8*  MCV 97.7  --  96.9  PLT 125*  --  [id number]   Basic Metabolic Panel Recent Labs  Lab 02/07/21 1733 02/07/21 1756 02/08/21 0641  NA 127* 134* 133*  K 6.6* 4.2 4.5  CL  --  93* 97*  CO2  --  25 24  GLUCOSE  --  113* 133*  BUN  --  19 24*  CREATININE  --  5.74* 7.13*  CALCIUM  --  9.3 9.4    Physical Exam  Blood pressure (!) 151/82, pulse 66, temperature (!) 97.4 F (36.3 C), temperature source Oral, resp. rate 19, height '5\' 7"'$  (1.702 m), weight 79.4 kg, SpO2 98 %.  GEN: somnolent however rousable, EYES: no scleral icterus, normal EOM  CV: S1+S2 present, irregularly irregular  PULM: CTAB anteriorly  ABD: abdomen soft, tender LUQ, no guarding  SKIN: warm and dry  EXT:  1+ peripheral edema bilaterally   Assessment  1. COVID PNA 2. New Afib 3. Rib # 4. T2DM 5. HTN 6. Chronic thrombocytopneia  7. Leukopenia 8. Anemia of chronic disease 9. Prolonged QT   Plan 1. ESRD, HD TTS-pt endorses compliance, pt does not appear fluid overloaded on exam  today. Resume normal hemodialysis schedule and avoid nephrotoxic agents. 2. Immunosuppressants- pt is on tacrolimus,myycophenolic acid and prednisone,  will defer management to Dr Joelyn Oms.  [name]  [id number] pgr 02/08/2021, 10:38 AM

## 2021-02-08 NOTE — Progress Notes (Signed)
Pt. Arrived from [facility]. [facility] admissions paged to make aware.

## 2021-02-08 NOTE — Progress Notes (Signed)
BLE venous duplex has been completed.  Results can be found under chart review under CV PROC. 02/08/2021 4:46 PM Loda Bialas RVT, RDMS

## 2021-02-09 ENCOUNTER — Other Ambulatory Visit (HOSPITAL_COMMUNITY): Payer: Self-pay

## 2021-02-09 DIAGNOSIS — I4891 Unspecified atrial fibrillation: Secondary | ICD-10-CM | POA: Diagnosis not present

## 2021-02-09 DIAGNOSIS — J9601 Acute respiratory failure with hypoxia: Secondary | ICD-10-CM

## 2021-02-09 DIAGNOSIS — I2699 Other pulmonary embolism without acute cor pulmonale: Secondary | ICD-10-CM | POA: Diagnosis not present

## 2021-02-09 DIAGNOSIS — U071 COVID-19: Secondary | ICD-10-CM

## 2021-02-09 DIAGNOSIS — J1282 Pneumonia due to coronavirus disease 2019: Secondary | ICD-10-CM

## 2021-02-09 LAB — COMPREHENSIVE METABOLIC PANEL
ALT: 13 U/L (ref 0–44)
AST: 22 U/L (ref 15–41)
Albumin: 2.4 g/dL — ABNORMAL LOW (ref 3.5–5.0)
Alkaline Phosphatase: 111 U/L (ref 38–126)
Anion gap: 13 (ref 5–15)
BUN: 47 mg/dL — ABNORMAL HIGH (ref 8–23)
CO2: 23 mmol/L (ref 22–32)
Calcium: 9.2 mg/dL (ref 8.9–10.3)
Chloride: 95 mmol/L — ABNORMAL LOW (ref 98–111)
Creatinine, Ser: 8.44 mg/dL — ABNORMAL HIGH (ref 0.61–1.24)
GFR, Estimated: 6 mL/min — ABNORMAL LOW (ref >60)
Glucose, Bld: 155 mg/dL — ABNORMAL HIGH (ref 70–99)
Potassium: 5.1 mmol/L (ref 3.5–5.1)
Sodium: 131 mmol/L — ABNORMAL LOW (ref 135–145)
Total Bilirubin: 1.4 mg/dL — ABNORMAL HIGH (ref 0.3–1.2)
Total Protein: 6.8 g/dL (ref 6.5–8.1)

## 2021-02-09 LAB — CBC WITH DIFFERENTIAL/PLATELET
Abs Immature Granulocytes: 0.02 10*3/uL (ref 0.00–0.07)
Basophils Absolute: 0 10*3/uL (ref 0.0–0.1)
Basophils Relative: 0 %
Eosinophils Absolute: 0 10*3/uL (ref 0.0–0.5)
Eosinophils Relative: 0 %
HCT: 34.4 % — ABNORMAL LOW (ref 39.0–52.0)
Hemoglobin: 11.5 g/dL — ABNORMAL LOW (ref 13.0–17.0)
Immature Granulocytes: 1 %
Lymphocytes Relative: 10 %
Lymphs Abs: 0.4 10*3/uL — ABNORMAL LOW (ref 0.7–4.0)
MCH: 31.9 pg (ref 26.0–34.0)
MCHC: 33.4 g/dL (ref 30.0–36.0)
MCV: 95.3 fL (ref 80.0–100.0)
Monocytes Absolute: 0.2 10*3/uL (ref 0.1–1.0)
Monocytes Relative: 4 %
Neutro Abs: 3.5 10*3/uL (ref 1.7–7.7)
Neutrophils Relative %: 85 %
Platelets: 126 10*3/uL — ABNORMAL LOW (ref 150–400)
RBC: 3.61 MIL/uL — ABNORMAL LOW (ref 4.22–5.81)
RDW: 16 % — ABNORMAL HIGH (ref 11.5–15.5)
WBC: 4.1 10*3/uL (ref 4.0–10.5)
nRBC: 0 % (ref 0.0–0.2)

## 2021-02-09 LAB — GLUCOSE, CAPILLARY
Glucose-Capillary: 121 mg/dL — ABNORMAL HIGH (ref 70–99)
Glucose-Capillary: 135 mg/dL — ABNORMAL HIGH (ref 70–99)
Glucose-Capillary: 143 mg/dL — ABNORMAL HIGH (ref 70–99)
Glucose-Capillary: 147 mg/dL — ABNORMAL HIGH (ref 70–99)
Glucose-Capillary: 157 mg/dL — ABNORMAL HIGH (ref 70–99)
Glucose-Capillary: 206 mg/dL — ABNORMAL HIGH (ref 70–99)

## 2021-02-09 LAB — MAGNESIUM: Magnesium: 2 mg/dL (ref 1.7–2.4)

## 2021-02-09 LAB — FERRITIN: Ferritin: 1419 ng/mL — ABNORMAL HIGH (ref 24–336)

## 2021-02-09 LAB — PROCALCITONIN: Procalcitonin: 1.78 ng/mL

## 2021-02-09 LAB — HEPARIN LEVEL (UNFRACTIONATED)
Heparin Unfractionated: 0.79 IU/mL — ABNORMAL HIGH (ref 0.30–0.70)
Heparin Unfractionated: 0.65 IU/mL (ref 0.30–0.70)

## 2021-02-09 LAB — C-REACTIVE PROTEIN: CRP: 4.8 mg/dL — ABNORMAL HIGH (ref <1.0)

## 2021-02-09 LAB — D-DIMER, QUANTITATIVE: D-Dimer, Quant: 1.42 ug/mL-FEU — ABNORMAL HIGH (ref 0.00–0.50)

## 2021-02-09 LAB — PHOSPHORUS: Phosphorus: 8.3 mg/dL — ABNORMAL HIGH (ref 2.5–4.6)

## 2021-02-09 MED ORDER — ASPIRIN EC 81 MG PO TBEC
81.0000 mg | DELAYED_RELEASE_TABLET | Freq: Every day | ORAL | Status: DC
  Administered 2021-02-09 – 2021-02-12 (×4): 81 mg via ORAL
  Filled 2021-02-09 (×4): qty 1

## 2021-02-09 MED ORDER — SODIUM CHLORIDE 0.9 % IV SOLN: 100.0000 mL | INTRAVENOUS | Status: DC | PRN

## 2021-02-09 MED ORDER — PANTOPRAZOLE SODIUM 40 MG PO TBEC
40.0000 mg | DELAYED_RELEASE_TABLET | Freq: Two times a day (BID) | ORAL | Status: DC
  Administered 2021-02-09 – 2021-02-12 (×7): 40 mg via ORAL
  Filled 2021-02-09 (×7): qty 1

## 2021-02-09 MED ORDER — CARVEDILOL 6.25 MG PO TABS
6.2500 mg | ORAL_TABLET | Freq: Two times a day (BID) | ORAL | Status: DC
  Administered 2021-02-09 – 2021-02-10 (×4): 6.25 mg via ORAL
  Filled 2021-02-09 (×4): qty 1

## 2021-02-09 MED ORDER — MAGNESIUM OXIDE -MG SUPPLEMENT 400 (240 MG) MG PO TABS
400.0000 mg | ORAL_TABLET | Freq: Every day | ORAL | Status: DC
  Administered 2021-02-09 – 2021-02-12 (×4): 400 mg via ORAL
  Filled 2021-02-09 (×4): qty 1

## 2021-02-09 MED ORDER — LISINOPRIL 5 MG PO TABS
5.0000 mg | ORAL_TABLET | Freq: Every day | ORAL | Status: DC
  Administered 2021-02-09 – 2021-02-12 (×4): 5 mg via ORAL
  Filled 2021-02-09 (×4): qty 1

## 2021-02-09 MED ORDER — LIDOCAINE HCL (PF) 1 % IJ SOLN: 5.0000 mL | INTRAMUSCULAR | Status: DC | PRN

## 2021-02-09 MED ORDER — LIDOCAINE-PRILOCAINE 2.5-2.5 % EX CREA
1.0000 "application " | TOPICAL_CREAM | CUTANEOUS | Status: DC | PRN
  Filled 2021-02-09: qty 5

## 2021-02-09 MED ORDER — PENTAFLUOROPROP-TETRAFLUOROETH EX AERO: 1.0000 "application " | INHALATION_SPRAY | CUTANEOUS | Status: DC | PRN

## 2021-02-09 NOTE — Discharge Instructions (Addendum)
Information on my medicine - ELIQUIS (apixaban)  This medication education was reviewed with me or my healthcare representative as part of my discharge preparation.     Why was Eliquis prescribed for you? Eliquis was prescribed for you to reduce the risk of a blood clot forming that can cause a stroke if you have a medical condition called atrial fibrillation (a type of irregular heartbeat).  What do You need to know about Eliquis ? Take your Eliquis TWICE DAILY - one tablet in the morning and one tablet in the evening with or without food. If you have difficulty swallowing the tablet whole please discuss with your pharmacist how to take the medication safely.  Take Eliquis exactly as prescribed by your doctor and DO NOT stop taking Eliquis without talking to the doctor who prescribed the medication.  Stopping may increase your risk of developing a stroke.  Refill your prescription before you run out.  After discharge, you should have regular check-up appointments with your healthcare provider that is prescribing your Eliquis.  In the future your dose may need to be changed if your kidney function or weight changes by a significant amount or as you get older.  What do you do if you miss a dose? If you miss a dose, take it as soon as you remember on the same day and resume taking twice daily.  Do not take more than one dose of ELIQUIS at the same time to make up a missed dose.  Important Safety Information A possible side effect of Eliquis is bleeding. You should call your healthcare provider right away if you experience any of the following: ? Bleeding from an injury or your nose that does not stop. ? Unusual colored urine (red or dark brown) or unusual colored stools (red or black). ? Unusual bruising for unknown reasons. ? A serious fall or if you hit your head (even if there is no bleeding).  Some medicines may interact with Eliquis and might increase your risk of bleeding or  clotting while on Eliquis. To help avoid this, consult your healthcare provider or pharmacist prior to using any new prescription or non-prescription medications, including herbals, vitamins, non-steroidal anti-inflammatory drugs (NSAIDs) and supplements.  This website has more information on Eliquis (apixaban): http://www.eliquis.com/eliquis/home  =====================================================  Atrial Fibrillation    Atrial fibrillation is a type of heartbeat that is irregular or fast. If you have this condition, your heart beats without any order. This makes it hard for your heart to pump blood in a normal way. Atrial fibrillation may come and go, or it may become a long-lasting problem. If this condition is not treated, it can put you at higher risk for stroke, heart failure, and other heart problems.  What are the causes? This condition may be caused by diseases that damage the heart. They include:  High blood pressure.  Heart failure.  Heart valve disease.  Heart surgery. Other causes include:  Diabetes.  Thyroid disease.  Being overweight.  Kidney disease. Sometimes the cause is not known.  What increases the risk? You are more likely to develop this condition if:  You are older.  You smoke.  You exercise often and very hard.  You have a family history of this condition.  You are a man.  You use drugs.  You drink a lot of alcohol.  You have lung conditions, such as emphysema, pneumonia, or COPD.  You have sleep apnea.   What are the signs or symptoms? Common symptoms  of this condition include:  A feeling that your heart is beating very fast.  Chest pain or discomfort.  Feeling short of breath.  Suddenly feeling light-headed or weak.  Getting tired easily during activity.  Fainting.  Sweating. In some cases, there are no symptoms.  How is this treated? Treatment for this condition depends on underlying conditions and how you feel  when you have atrial fibrillation. They include: 1. Medicines to: ? Prevent blood clots. ? Treat heart rate or heart rhythm problems. 2. Using devices, such as a pacemaker, to correct heart rhythm problems. 3. Doing surgery to remove the part of the heart that sends bad signals. 4. Closing an area where clots can form in the heart (left atrial appendage). In some cases, your doctor will treat other underlying conditions.  Follow these instructions at home:  Medicines 1. Take over-the-counter and prescription medicines only as told by your doctor. 2. Do not take any new medicines without first talking to your doctor. 3. If you are taking blood thinners: ? Talk with your doctor before you take any medicines that have aspirin or NSAIDs, such as ibuprofen, in them. ? Take your medicine exactly as told by your doctor. Take it at the same time each day. ? Avoid activities that could hurt or bruise you. Follow instructions about how to prevent falls. ? Wear a bracelet that says you are taking blood thinners. Or, carry a card that lists what medicines you take. Lifestyle          Do not use any products that have nicotine or tobacco in them. These include cigarettes, e-cigarettes, and chewing tobacco. If you need help quitting, ask your doctor.  Eat heart-healthy foods. Talk with your doctor about the right eating plan for you.  Exercise regularly as told by your doctor.  Do not drink alcohol.  Lose weight if you are overweight.  Do not use drugs, including cannabis.  General instructions  If you have a condition that causes breathing to stop for a short period of time (apnea), treat it as told by your doctor.  Keep a healthy weight. Do not use diet pills unless your doctor says they are safe for you. Diet pills may make heart problems worse.  Keep all follow-up visits as told by your doctor. This is important.  Contact a doctor if:  You notice a change in the speed, rhythm,  or strength of your heartbeat.  You are taking a blood-thinning medicine and you get more bruising.  You get tired more easily when you move or exercise.  You have a sudden change in weight.  Get help right away if:    1. You have pain in your chest or your belly (abdomen). 2. You have trouble breathing. 3. You have side effects of blood thinners, such as blood in your vomit, poop (stool), or pee (urine), or bleeding that cannot stop. 4. You have any signs of a stroke. "BE FAST" is an easy way to remember the main warning signs: ? B - Balance. Signs are dizziness, sudden trouble walking, or loss of balance. ? E - Eyes. Signs are trouble seeing or a change in how you see. ? F - Face. Signs are sudden weakness or loss of feeling in the face, or the face or eyelid drooping on one side. ? A - Arms. Signs are weakness or loss of feeling in an arm. This happens suddenly and usually on one side of the body. ? S - Speech.  Signs are sudden trouble speaking, slurred speech, or trouble understanding what people say. ? T - Time. Time to call emergency services. Write down what time symptoms started. 5. You have other signs of a stroke, such as: ? A sudden, very bad headache with no known cause. ? Feeling like you may vomit (nausea). ? Vomiting. ? A seizure.  These symptoms may be an emergency. Do not wait to see if the symptoms will go away. Get medical help right away. Call your local emergency services (911 in the [place] Do not drive yourself to the hospital. Summary  Atrial fibrillation is a type of heartbeat that is irregular or fast.  You are at higher risk of this condition if you smoke, are older, have diabetes, or are overweight.  Follow your doctor's instructions about medicines, diet, exercise, and follow-up visits.  Get help right away if you have signs or symptoms of a stroke.  Get help right away if you cannot catch your breath, or you have chest pain or discomfort. This  information is not intended to replace advice given to you by your health care provider. Make sure you discuss any questions you have with your health care provider. Document Revised: 03/18/2019 Document Reviewed: 03/18/2019 Elsevier Patient Education  [place].

## 2021-02-09 NOTE — Progress Notes (Signed)
Admit: 02/07/2021 LOS: 1  18M ESRD THS [place] RUE AVF with PNA, COVID19 infection, rib fractures, AFib with RVR, new PE  Subjective:  . No new c/o . For HD today   05/04 0701 - 05/05 0700 In: 619.4 [I.V.:175.8; IV Piggyback:443.6] Out: -   Filed Weights   02/07/21 1615 02/08/21 0400 02/09/21 0000  Weight: 78.5 kg 79.4 kg 81.1 kg    Scheduled Meds: . albuterol  2 puff Inhalation Q6H  . vitamin C  500 mg Oral Daily  . Chlorhexidine Gluconate Cloth  6 each Topical Q0600  . insulin aspart  0-9 Units Subcutaneous Q4H  . insulin detemir  0.1 Units/kg Subcutaneous BID  . lidocaine  1 patch Transdermal Q24H  . linagliptin  5 mg Oral Daily  . methylPREDNISolone (SOLU-MEDROL) injection  40 mg Intravenous Q12H   Followed by  . [START ON 02/11/2021] predniSONE  50 mg Oral Daily  . pneumococcal 23 valent vaccine  0.5 mL Intramuscular Tomorrow-1000  . zinc sulfate  220 mg Oral Daily   Continuous Infusions: . sodium chloride Stopped (02/07/21 2257)  . sodium chloride Stopped (02/07/21 2257)  . cefTRIAXone (ROCEPHIN)  IV Stopped (02/09/21 0951)  . doxycycline (VIBRAMYCIN) IV 100 mg (02/09/21 1001)  . heparin 1,150 Units/hr (02/09/21 0959)  . remdesivir 100 mg in NS 100 mL 200 mL/hr at 02/09/21 0959   PRN Meds:.sodium chloride, sodium chloride, acetaminophen, chlorpheniramine-HYDROcodone, guaiFENesin-dextromethorphan, HYDROcodone-acetaminophen, ondansetron **OR** ondansetron (ZOFRAN) IV, prochlorperazine  Current Labs: reviewed    Physical Exam:  Blood pressure 133/77, pulse 80, temperature 98 F (36.7 C), resp. rate 19, height '5\' 7"'$  (1.702 m), weight 81.1 kg, SpO2 97 %. NAD RUE AVF aneurysma/tortuous, +B/T RRR CTAB nl wob, speaks full sentences No LEE  A 1. ESRD [facility]S RUE AVF [facility] 1. Will be on MWF CoVID shift at [facility] 2. HD t oday on schedule  2. Failed Kidney transplant: patient is poor historian and it is unclear if he takes IS still and if so, why.  While on  high dose steroids ok to hold his MMF and Pred.  He should review with his nehprologist at [facility] the indication for using IS 3. COVID19 infection on steriods/remdesivir 4. ? HCAP ceftriaxone 5. Acute PE hep gtt 6. AFib hep gtt 7. Rib Fractures  P . As above HD today on COVID precatuions . Medication Issues; o Preferred narcotic agents for pain control are hydromorphone, fentanyl, and methadone. Morphine should not be used.  o Baclofen should be avoided o Avoid oral sodium phosphate and magnesium citrate based laxatives / bowel preps    Pearson Grippe MD 02/09/2021, 12:02 PM  Recent Labs  Lab 02/07/21 1756 02/08/21 0641 02/09/21 0249  NA 134* 133* 131*  K 4.2 4.5 5.1  CL 93* 97* 95*  CO2 '25 24 23  '$ GLUCOSE 113* 133* 155*  BUN 19 24* 47*  CREATININE 5.74* 7.13* 8.44*  CALCIUM 9.3 9.4 9.2  PHOS  --   --  8.3*   Recent Labs  Lab 02/07/21 1711 02/07/21 1733 02/08/21 0641 02/09/21 0249  WBC 4.8  --  2.7* 4.1  NEUTROABS 3.0  --  2.3 3.5  HGB 12.6* 12.9* 11.5* 11.5*  HCT 38.4* 38.0* 34.8* 34.4*  MCV 97.7  --  96.9 95.3  PLT 125*  --  127* 126*

## 2021-02-09 NOTE — Progress Notes (Signed)
ANTICOAGULATION CONSULT NOTE - Follow Up Consult  Pharmacy Consult for IV Heparin  Indication: PE and new afib  No Known Allergies  Patient Measurements: Height: '5\' 7"'$  (170.2 cm) Weight: 81.1 kg (178 lb 12.7 oz) IBW/kg (Calculated) : 66.1 Heparin Dosing Weight:  79.4 kg  Vital Signs: Temp: 98.5 F (36.9 C) (05/05 0000) Temp Source: Oral (05/05 0000) BP: 120/75 (05/05 0000) Pulse Rate: 67 (05/05 0000)  Labs: Recent Labs    02/07/21 1711 02/07/21 1733 02/07/21 1756 02/07/21 2018 02/08/21 0641 02/08/21 1610 02/09/21 0249  HGB 12.6* 12.9*  --   --  11.5*  --  11.5*  HCT 38.4* 38.0*  --   --  34.8*  --  34.4*  PLT 125*  --   --   --  127*  --  126*  HEPARINUNFRC  --   --   --   --  0.15* 0.32 0.79*  CREATININE  --   --  5.74*  --  7.13*  --  8.44*  TROPONINIHS 253*  --   --  298*  --   --   --     Estimated Creatinine Clearance: 7.7 mL/min (A) (by C-G formula based on SCr of 8.44 mg/dL (H)).  Assessment: Anticoag: Possible PE + new afib, COVID+. Pt with ESRD, on HD; he reports that he does not get anticoagulation during HD due to GIB (?), so will dose heparin cautiously; D-dimer 2.49; Hgb down to 11.5 today; plt 127 (stable low, chronic).   Heparin level ~7.5 hrs after heparin 2300 units IV bolus X 1, followed by increasing heparin infusion to 1200 units/hr, was 0.32 units/ml, which is at the low end of the goal range for this pt. Per RN, no issues with IV or bleeding observed.  5/5 AM update:  Heparin level just above goal Hgb stable  Goal of Therapy:  Heparin level 0.3-0.7 units/ml Monitor platelets by anticoagulation protocol: Yes   Plan:  Dec heparin to 1150 units/hr 1200 heparin level  Narda Bonds, PharmD, BCPS Clinical Pharmacist Phone: [phone]

## 2021-02-09 NOTE — TOC CAGE-AID Note (Signed)
Transition of Care [facility]) - CAGE-AID Screening   Patient Details  Name: Mathew Pierce MRN: [id number] Date of Birth: [birth year]  Transition of Care [facility]) CM/SW Contact:    Bethann Berkshire, [place] Phone Number: 02/09/2021, 2:49 PM   Clinical Narrative:  CSW completed CAGE-AID with pt with score of 0. Pt states "I don't drink period." He reports he does not use any other substances.   CAGE-AID Screening:    Have You Ever Felt You Ought to Cut Down on Your Drinking or Drug Use?: No Have People Annoyed You By Critizing Your Drinking Or Drug Use?: No Have You Felt Bad Or Guilty About Your Drinking Or Drug Use?: No Have You Ever Had a Drink or Used Drugs First Thing In The Morning to Steady Your Nerves or to Get Rid of a Hangover?: No CAGE-AID Score: 0  Substance Abuse Education Offered: No

## 2021-02-09 NOTE — Progress Notes (Signed)
ANTICOAGULATION CONSULT NOTE - Follow Up Consult  Pharmacy Consult for IV Heparin  Indication: possible PE and new afib  No Known Allergies  Patient Measurements: Height: '5\' 7"'$  (170.2 cm) Weight: 81.1 kg (178 lb 12.7 oz) IBW/kg (Calculated) : 66.1 Heparin Dosing Weight:  79.4 kg  Vital Signs: Temp: 96.4 F (35.8 C) (05/05 0745) Temp Source: Oral (05/05 0745) BP: 116/78 (05/05 0745) Pulse Rate: 79 (05/05 0745)  Labs: Recent Labs    02/07/21 1711 02/07/21 1733 02/07/21 1756 02/07/21 2018 02/08/21 0641 02/08/21 1610 02/09/21 0249  HGB 12.6* 12.9*  --   --  11.5*  --  11.5*  HCT 38.4* 38.0*  --   --  34.8*  --  34.4*  PLT 125*  --   --   --  127*  --  126*  HEPARINUNFRC  --   --   --   --  0.15* 0.32 0.79*  CREATININE  --   --  5.74*  --  7.13*  --  8.44*  TROPONINIHS 253*  --   --  298*  --   --   --     Estimated Creatinine Clearance: 7.7 mL/min (A) (by C-G formula based on SCr of 8.44 mg/dL (H)).  Assessment: 75 yo M with possible acute PE on CT scan and new afib, also COVID+. Dopplers negative for LE DVT.  Pt with ESRD, on HD; he reports that he does not get anticoagulation during HD due to GIB (?), so will dose heparin cautiously. D-dimer 2.49 on admit. No anticoagulation prior to admission. Pharmacy consulted for heparin.    Heparin level 0.65 on 1150 units/hr. H/H, plt stable.    Goal of Therapy:  Heparin level 0.3-0.7 units/ml Monitor platelets by anticoagulation protocol: Yes   Plan:  Continue heparin 1150 units/hr Monitor daily HL, CBC/plt Monitor for signs/symptoms of bleeding  F/u transition to apixaban  Benetta Spar, PharmD, BCPS, [place] Clinical Pharmacist  Please check AMION for all [place] phone numbers After 10:00 PM, call [place]

## 2021-02-09 NOTE — TOC Benefit Eligibility Note (Signed)
Patient Teacher, English as a foreign language completed.    The patient is currently admitted and upon discharge could be taking Eliquis 5 mg.  The current 30 day co-pay is, $9.85.   The patient is insured through [place], [place] Patient Advocate Specialist [place] Team Direct Number: [phone]  Fax: [phone]

## 2021-02-09 NOTE — Progress Notes (Signed)
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 heart function. Mediastinum/Nodes: AP window lymph node 0.8 cm in short axis, within normal limits. No pathologic adenopathy identified. Lungs/Pleura: Small right and trace left pleural effusions. Hazy airspace opacity in the posterior basal segments of both lungs favoring atelectasis over pneumonia. Currently we do not show the peripheral  ground-glass opacity scattered in the lungs characteristic of COVID pneumonia. Musculoskeletal: There are acute fractures of the left seventh, eighth, and ninth ribs posterolaterally. 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 Bridging spurring anteriorly through most of the thoracic spine. Chronic anterior wedging at T12. Review of the MIP  images confirms the above findings. CT ABDOMEN and PELVIS FINDINGS Hepatobiliary: Faint heterogeneity in the liver potentially from hepatic congestion no compelling morphology of cirrhosis. Correlate with liver enzymes and potentially hepatitis panel given the heterogeneous enhancement in the liver. High density in the indistinct and blurred gallbladder, possibly from gallstones. No biliary dilatation. Pancreas: Unremarkable Spleen: Unremarkable Adrenals/Urinary Tract: Both adrenal glands appear normal. Severe bilateral renal atrophy. 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 Stomach/Bowel: Unremarkable Vascular/Lymphatic: Prominent atherosclerotic vascular calcification. No pathologic adenopathy identified. Reproductive: Unremarkable Other: Small amount of ascites. Mild mesenteric edema and mild subcutaneous edema. 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 Review of the MIP images confirms the above findings. IMPRESSION: 1. Questionable subsegmental filling defect in a branch of the right lower lobe posterior basal segment pulmonary artery. This could well be from chronic pulmonary embolus given the somewhat peripheral location, but subsegmental acute pulmonary embolus is difficult to completely exclude. No segmental or lobar pulmonary embolus is identified. 2. Moderate cardiomegaly with extensive atherosclerosis throughout the chest, abdomen, and pelvis. 3. 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 4. Small right and trace left pleural effusions. Minimal bibasilar atelectasis. 5. Acute fractures of the left seventh, eighth, and ninth ribs posterolaterally. 6. 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 7. 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 8. Small amount of ascites with diffuse mesenteric  and subcutaneous edema compatible with third spacing of fluid. Electronically Signed   By: Van Clines M.D.   On: 02/07/2021 21:12   DG Chest Portable 1 View  Result Date: 02/07/2021 CLINICAL DATA:  Left-sided pleuritic chest pain, hypoxia, short of breath EXAM: PORTABLE CHEST 1 VIEW COMPARISON:  04/22/2020 FINDINGS: Single frontal view of the chest demonstrates stable enlargement of the cardiac silhouette. Increased density in the retrocardiac region may reflect left lower lobe airspace disease or atelectasis. Small left pleural effusion versus pleural thickening obscures the lateral costophrenic angle. The right chest is clear. No pneumothorax. Stable vascular stent right subclavian region. No acute bony abnormalities. IMPRESSION: 1. Retrocardiac left lower lobe consolidation and small left pleural effusion. Findings are consistent with left lower lobe pneumonia. Electronically Signed   By: Randa Ngo M.D.   On: 02/07/2021 18:30   VAS [place] LOWER EXTREMITY VENOUS (DVT)  Result Date: 02/08/2021  Lower Venous DVT Study Patient Name:  LANDIS VARON Lukas  Date of Exam:   02/08/2021 Medical Rec #: [id number]      Accession #:    [id number] Date of Birth: 1946-02-25      Patient Gender: M Patient Age:   68Y Exam Location:  [facility] Procedure:      VAS [place] LOWER EXTREMITY VENOUS (DVT) Referring Phys: [id number] RONDELL A SMITH --------------------------------------------------------------------------------  Indications: Pulmonary embolism. Other Indications: COVID+. Comparison Study: No previous exams Performing Technologist: Rogelia Rohrer  Examination Guidelines: A complete evaluation includes B-mode imaging, spectral Doppler, color Doppler, and power Doppler as needed of all accessible portions of each vessel. Bilateral testing is considered an integral part of a complete examination. Limited examinations for reoccurring indications  may be performed as noted. The reflux portion of the exam is performed with  the patient in reverse Trendelenburg.  +---------+---------------+---------+-----------+----------+--------------+ RIGHT    CompressibilityPhasicitySpontaneityPropertiesThrombus Aging +---------+---------------+---------+-----------+----------+--------------+ CFV      Full           Yes      Yes                                 +---------+---------------+---------+-----------+----------+--------------+ SFJ      Full                                                        +---------+---------------+---------+-----------+----------+--------------+ FV Prox  Full           Yes      Yes                                 +---------+---------------+---------+-----------+----------+--------------+ FV Mid   Full           Yes      Yes                                 +---------+---------------+---------+-----------+----------+--------------+ FV DistalFull           Yes      Yes                                 +---------+---------------+---------+-----------+----------+--------------+ PFV      Full                                                        +---------+---------------+---------+-----------+----------+--------------+ POP      Full           Yes      Yes                                 +---------+---------------+---------+-----------+----------+--------------+ PTV      Full                                                        +---------+---------------+---------+-----------+----------+--------------+ PERO     Full                                                        +---------+---------------+---------+-----------+----------+--------------+   +---------+---------------+---------+-----------+----------+--------------+ LEFT     CompressibilityPhasicitySpontaneityPropertiesThrombus Aging +---------+---------------+---------+-----------+----------+--------------+ CFV      Full           Yes      Yes                                  +---------+---------------+---------+-----------+----------+--------------+  SFJ      Full                                                        +---------+---------------+---------+-----------+----------+--------------+ FV Prox  Full           Yes      Yes                                 +---------+---------------+---------+-----------+----------+--------------+ FV Mid   Full           Yes      Yes                                 +---------+---------------+---------+-----------+----------+--------------+ FV DistalFull           Yes      Yes                                 +---------+---------------+---------+-----------+----------+--------------+ PFV      Full                                                        +---------+---------------+---------+-----------+----------+--------------+ POP      Full           Yes      Yes                                 +---------+---------------+---------+-----------+----------+--------------+ PTV      Full                                                        +---------+---------------+---------+-----------+----------+--------------+ PERO     Full                                                        +---------+---------------+---------+-----------+----------+--------------+     Summary: BILATERAL: - No evidence of deep vein thrombosis seen in the lower extremities, bilaterally. - No evidence of superficial venous thrombosis in the lower extremities, bilaterally. -No evidence of popliteal cyst, bilaterally.   *See table(s) above for measurements and observations. Electronically signed by Jamelle Haring on 02/08/2021 at 5:03:00 PM.    Final    ECHOCARDIOGRAM LIMITED  Result Date: 02/08/2021    ECHOCARDIOGRAM LIMITED REPORT   Patient Name:   RAHIL REH Rowe Date of Exam: 02/08/2021 Medical Rec #:  [id number]     Height:       67.0 in Accession #:    [id number]    Weight:       175.0 lb Date of Birth:  [birth year]  BSA:           1.911 m Patient Age:    89 years      BP:           138/78 mmHg Patient Gender: M             HR:           70 bpm. Exam Location:  Inpatient Procedure: Limited Echo, Cardiac Doppler and Color Doppler Indications:    Atrial fibrillation  History:        Patient has no prior history of Echocardiogram examinations.                 Risk Factors:Diabetes and Hypertension. ESRD. COVID-19.  Sonographer:    Clayton Lefort RDCS (AE) Referring Phys: [id number] [facility] A SMITH  Sonographer Comments: COVID-19 IMPRESSIONS  1. Left ventricular ejection fraction, by estimation, is 25%. The left ventricle demonstrates global hypokinesis. The left ventricular internal cavity size was moderately to severely dilated. There is moderate left ventricular hypertrophy.  2. Right ventricular systolic function is moderately reduced. The right ventricular size is moderately enlarged. There is normal pulmonary artery systolic pressure.  3. Left atrial size was moderately dilated.  4. Right atrial size was moderately dilated.  5. The pericardial effusion is localized near the right atrium and lateral to the left ventricle.  6. The mitral valve is abnormal. Trivial mitral valve regurgitation. Severe mitral annular calcification.  7. Tricuspid valve regurgitation is moderate.  8. AV calcified likely some degree of stenosis given mean gradient of 6 with severely reduced EF but DVI normal 0.95 and AVA calculates at 2.8 cm2. FINDINGS  Left Ventricle: Left ventricular ejection fraction, by estimation, is 25%. The left ventricle demonstrates global hypokinesis. The left ventricular internal cavity size was moderately to severely dilated. There is moderate left ventricular hypertrophy. Right Ventricle: The right ventricular size is moderately enlarged. Right ventricular systolic function is moderately reduced. There is normal pulmonary artery systolic pressure. The tricuspid regurgitant velocity is 2.58 m/s, and with an assumed right atrial pressure of 8  mmHg, the estimated right ventricular systolic pressure is [id number] mmHg. Left Atrium: Left atrial size was moderately dilated. Right Atrium: Right atrial size was moderately dilated. Pericardium: Trivial pericardial effusion is present. The pericardial effusion is localized near the right atrium and lateral to the left ventricle. Mitral Valve: The mitral valve is abnormal. There is moderate thickening of the mitral valve leaflet(s). There is moderate calcification of the mitral valve leaflet(s). Severe mitral annular calcification. Trivial mitral valve regurgitation. Tricuspid Valve: Tricuspid valve regurgitation is moderate. Aortic Valve: AV calcified likely some degree of stenosis given mean gradient of 6 with severely reduced EF but DVI normal 0.95 and AVA calculates at 2.8 cm2. Aortic valve mean gradient measures 6.0 mmHg. Aortic valve peak gradient measures 9.6 mmHg. Aortic valve area, by VTI measures 3.30 cm. LEFT VENTRICLE PLAX 2D LVIDd:         6.10 cm LVIDs:         5.50 cm LV PW:         1.50 cm LV IVS:        1.50 cm LVOT diam:     2.10 cm LV SV:         91 LV SV Index:   48 LVOT Area:     3.46 cm  IVC IVC diam: 1.10 cm LEFT ATRIUM         Index LA diam:    4.70 cm  2.46 cm/m  AORTIC VALVE AV Area (Vmax):    3.28 cm AV Area (Vmean):   2.85 cm AV Area (VTI):     3.30 cm AV Vmax:           155.00 cm/s AV Vmean:          116.000 cm/s AV VTI:            0.277 m AV Peak Grad:      9.6 mmHg AV Mean Grad:      6.0 mmHg LVOT Vmax:         147.00 cm/s LVOT Vmean:        95.600 cm/s LVOT VTI:          0.264 m LVOT/AV VTI ratio: 0.95  AORTA Ao Root diam: 3.30 cm Ao Asc diam:  3.10 cm TRICUSPID VALVE TR Peak grad:   26.6 mmHg TR Vmax:        258.00 cm/s  SHUNTS Systemic VTI:  0.26 m Systemic Diam: 2.10 cm Jenkins Rouge MD Electronically signed by Jenkins Rouge MD Signature Date/Time: 02/08/2021/5:01:00 PM    Final

## 2021-02-10 ENCOUNTER — Telehealth: Payer: Self-pay | Admitting: Internal Medicine

## 2021-02-10 DIAGNOSIS — I2699 Other pulmonary embolism without acute cor pulmonale: Secondary | ICD-10-CM | POA: Diagnosis not present

## 2021-02-10 DIAGNOSIS — I4891 Unspecified atrial fibrillation: Secondary | ICD-10-CM | POA: Diagnosis not present

## 2021-02-10 DIAGNOSIS — U071 COVID-19: Secondary | ICD-10-CM | POA: Diagnosis not present

## 2021-02-10 DIAGNOSIS — S2242XA Multiple fractures of ribs, left side, initial encounter for closed fracture: Secondary | ICD-10-CM | POA: Diagnosis not present

## 2021-02-10 LAB — COMPREHENSIVE METABOLIC PANEL
ALT: 11 U/L (ref 0–44)
AST: 26 U/L (ref 15–41)
Albumin: 2.4 g/dL — ABNORMAL LOW (ref 3.5–5.0)
Alkaline Phosphatase: 106 U/L (ref 38–126)
Anion gap: 14 (ref 5–15)
BUN: 47 mg/dL — ABNORMAL HIGH (ref 8–23)
CO2: 24 mmol/L (ref 22–32)
Calcium: 9.1 mg/dL (ref 8.9–10.3)
Chloride: 96 mmol/L — ABNORMAL LOW (ref 98–111)
Creatinine, Ser: 6.66 mg/dL — ABNORMAL HIGH (ref 0.61–1.24)
GFR, Estimated: 8 mL/min — ABNORMAL LOW (ref >60)
Glucose, Bld: 162 mg/dL — ABNORMAL HIGH (ref 70–99)
Potassium: 4.1 mmol/L (ref 3.5–5.1)
Sodium: 134 mmol/L — ABNORMAL LOW (ref 135–145)
Total Bilirubin: 0.8 mg/dL (ref 0.3–1.2)
Total Protein: 6.6 g/dL (ref 6.5–8.1)

## 2021-02-10 LAB — I-STAT ARTERIAL BLOOD GAS, ED
Acid-Base Excess: 9 mmol/L — ABNORMAL HIGH (ref 0.0–2.0)
Bicarbonate: 27.2 mmol/L (ref 20.0–28.0)
Calcium, Ion: 0.74 mmol/L — CL (ref 1.15–1.40)
HCT: 38 % — ABNORMAL LOW (ref 39.0–52.0)
Hemoglobin: 12.9 g/dL — ABNORMAL LOW (ref 13.0–17.0)
O2 Saturation: 100 %
Patient temperature: 98.6
Potassium: 6.6 mmol/L (ref 3.5–5.1)
Sodium: 127 mmol/L — ABNORMAL LOW (ref 135–145)
TCO2: 28 mmol/L (ref 22–32)
pCO2 arterial: 20.2 mmHg — ABNORMAL LOW (ref 32.0–48.0)
pH, Arterial: 7.736 (ref 7.350–7.450)
pO2, Arterial: 209 mmHg — ABNORMAL HIGH (ref 83.0–108.0)

## 2021-02-10 LAB — CBC WITH DIFFERENTIAL/PLATELET
Abs Immature Granulocytes: 0.04 10*3/uL (ref 0.00–0.07)
Basophils Absolute: 0 10*3/uL (ref 0.0–0.1)
Basophils Relative: 0 %
Eosinophils Absolute: 0 10*3/uL (ref 0.0–0.5)
Eosinophils Relative: 0 %
HCT: 33.5 % — ABNORMAL LOW (ref 39.0–52.0)
Hemoglobin: 11.5 g/dL — ABNORMAL LOW (ref 13.0–17.0)
Immature Granulocytes: 1 %
Lymphocytes Relative: 4 %
Lymphs Abs: 0.2 10*3/uL — ABNORMAL LOW (ref 0.7–4.0)
MCH: 32 pg (ref 26.0–34.0)
MCHC: 34.3 g/dL (ref 30.0–36.0)
MCV: 93.3 fL (ref 80.0–100.0)
Monocytes Absolute: 0.2 10*3/uL (ref 0.1–1.0)
Monocytes Relative: 3 %
Neutro Abs: 5.3 10*3/uL (ref 1.7–7.7)
Neutrophils Relative %: 92 %
Platelets: 127 10*3/uL — ABNORMAL LOW (ref 150–400)
RBC: 3.59 MIL/uL — ABNORMAL LOW (ref 4.22–5.81)
RDW: 15.9 % — ABNORMAL HIGH (ref 11.5–15.5)
WBC: 5.8 10*3/uL (ref 4.0–10.5)
nRBC: 0.3 % — ABNORMAL HIGH (ref 0.0–0.2)

## 2021-02-10 LAB — MAGNESIUM: Magnesium: 1.8 mg/dL (ref 1.7–2.4)

## 2021-02-10 LAB — GLUCOSE, CAPILLARY
Glucose-Capillary: 140 mg/dL — ABNORMAL HIGH (ref 70–99)
Glucose-Capillary: 140 mg/dL — ABNORMAL HIGH (ref 70–99)
Glucose-Capillary: 147 mg/dL — ABNORMAL HIGH (ref 70–99)
Glucose-Capillary: 159 mg/dL — ABNORMAL HIGH (ref 70–99)
Glucose-Capillary: 169 mg/dL — ABNORMAL HIGH (ref 70–99)
Glucose-Capillary: 176 mg/dL — ABNORMAL HIGH (ref 70–99)

## 2021-02-10 LAB — C-REACTIVE PROTEIN: CRP: 3.5 mg/dL — ABNORMAL HIGH (ref <1.0)

## 2021-02-10 LAB — D-DIMER, QUANTITATIVE: D-Dimer, Quant: 1.43 ug/mL-FEU — ABNORMAL HIGH (ref 0.00–0.50)

## 2021-02-10 LAB — FERRITIN: Ferritin: 1812 ng/mL — ABNORMAL HIGH (ref 24–336)

## 2021-02-10 LAB — HEPARIN LEVEL (UNFRACTIONATED): Heparin Unfractionated: >1.1 IU/mL — ABNORMAL HIGH (ref 0.30–0.70)

## 2021-02-10 LAB — PHOSPHORUS: Phosphorus: 6 mg/dL — ABNORMAL HIGH (ref 2.5–4.6)

## 2021-02-10 MED ORDER — APIXABAN 5 MG PO TABS
5.0000 mg | ORAL_TABLET | Freq: Two times a day (BID) | ORAL | Status: DC
  Administered 2021-02-10 – 2021-02-12 (×5): 5 mg via ORAL
  Filled 2021-02-10 (×5): qty 1

## 2021-02-10 MED ORDER — MIDODRINE HCL 5 MG PO TABS
5.0000 mg | ORAL_TABLET | Freq: Two times a day (BID) | ORAL | Status: DC
  Administered 2021-02-10 (×2): 5 mg via ORAL
  Filled 2021-02-10 (×2): qty 1

## 2021-02-10 MED ORDER — DOXYCYCLINE HYCLATE 100 MG PO TABS
100.0000 mg | ORAL_TABLET | Freq: Two times a day (BID) | ORAL | Status: DC
  Administered 2021-02-10 – 2021-02-12 (×5): 100 mg via ORAL
  Filled 2021-02-10 (×5): qty 1

## 2021-02-10 MED ORDER — APIXABAN 5 MG PO TABS: 5.0000 mg | ORAL_TABLET | Freq: Two times a day (BID) | ORAL | Status: DC

## 2021-02-10 MED ORDER — ENSURE ENLIVE PO LIQD: 237.0000 mL | Freq: Two times a day (BID) | ORAL | Status: DC

## 2021-02-10 NOTE — TOC Progression Note (Signed)
Transition of Care [facility]) - Progression Note    Patient Details  Name: Mathew Pierce MRN: [id number] Date of Birth: [birth year]  [facility]) CM/SW Contact  Bartholomew Crews, RN Phone Number: [phone] 02/10/2021, 3:12 PM  Clinical Narrative:     Notified by renal navigator/Colleen of patient needing wheelchair Covid transportation while on covid shift for outpatient dialysis. Spoke with Suezanne Jacquet at [facility] transportation to arrange transportation on MWF 11:00 am - 3:30 pm from 5/9 - 5/23.        Expected Discharge Plan and Services                                                 Social Determinants of Health (SDOH) Interventions    Readmission Risk Interventions No flowsheet data found.

## 2021-02-10 NOTE — Progress Notes (Signed)
Per TOC CM/W. [facility] [employer] can arrange wheelchair transport for patient for 7 trips (5/9, 5/11, 5/13, 5/16, 5/18, 5/20, 5/23) while in [place] isolation MWF 11:00am. He needs to arrive to the side door of the clinic.  Navigator contacted patient's outpatient clinic and left message with this information and requested a call back to confirm.  Navigator contacted patient's wife again, Gwenyth Bender [phone] to ask if she agrees to above. She is agreeable and very appreciative. Renal Navigator informed Mrs. Swedlund that if patient misses any of the scheduled transports, all the following will be canceled. She states understanding.  Transportation will start Monday, for anticipated discharge Sunday, but if patient is still hospitalized, Navigator (Navigator will be in a remote conference on Monday) or CM will update [employer].  Navigator updated Attending and Nephrologist. Navigator asked that Renal PA fax discharge summary to outpatient HD clinic if patient is discharged over the weekend so they will have it first thing Monday morning.  Navigator is available for support and assistance as needed.   Alphonzo Cruise, [place] Renal Navigator [phone]

## 2021-02-10 NOTE — Progress Notes (Signed)
Admit: 02/07/2021 LOS: 2  77M ESRD THS [place] RUE AVF with PNA, COVID19 infection, rib fractures, AFib with RVR, new PE  Subjective:  . No new c/o . HD yesterday 3L UF . Arrangements for outpt COVID HD shift being arranged  05/05 0701 - 05/06 0700 In: 1706.5 [P.O.:640; I.V.:284.8; IV Piggyback:781.7] Out: 3000   Filed Weights   02/09/21 1350 02/09/21 1704 02/10/21 0543  Weight: 84.8 kg 81.5 kg 83.3 kg    Scheduled Meds: . albuterol  2 puff Inhalation Q6H  . apixaban  5 mg Oral BID  . vitamin C  500 mg Oral Daily  . aspirin EC  81 mg Oral Daily  . carvedilol  6.25 mg Oral BID  . Chlorhexidine Gluconate Cloth  6 each Topical Q0600  . doxycycline  100 mg Oral Q12H  . feeding supplement  237 mL Oral BID BM  . insulin aspart  0-9 Units Subcutaneous Q4H  . insulin detemir  0.1 Units/kg Subcutaneous BID  . lidocaine  1 patch Transdermal Q24H  . linagliptin  5 mg Oral Daily  . lisinopril  5 mg Oral Daily  . magnesium oxide  400 mg Oral Daily  . methylPREDNISolone (SOLU-MEDROL) injection  40 mg Intravenous Q12H   Followed by  . [START ON 02/11/2021] predniSONE  50 mg Oral Daily  . midodrine  5 mg Oral BID WC  . pantoprazole  40 mg Oral BID  . pneumococcal 23 valent vaccine  0.5 mL Intramuscular Tomorrow-1000  . zinc sulfate  220 mg Oral Daily   Continuous Infusions: . sodium chloride Stopped (02/07/21 2257)  . sodium chloride Stopped (02/07/21 2257)  . sodium chloride    . sodium chloride    . cefTRIAXone (ROCEPHIN)  IV 2 g (02/10/21 0910)  . remdesivir 100 mg in NS 100 mL 100 mg (02/10/21 0912)   PRN Meds:.sodium chloride, sodium chloride, sodium chloride, sodium chloride, acetaminophen, chlorpheniramine-HYDROcodone, guaiFENesin-dextromethorphan, HYDROcodone-acetaminophen, lidocaine (PF), lidocaine-prilocaine, ondansetron **OR** ondansetron (ZOFRAN) IV, pentafluoroprop-tetrafluoroeth, prochlorperazine  Current Labs: reviewed    Physical Exam:  Blood pressure 110/70,  pulse 64, temperature 98.5 F (36.9 C), temperature source Oral, resp. rate 10, height '5\' 7"'$  (1.702 m), weight 83.3 kg, SpO2 96 %. NAD RUE AVF aneurysma/tortuous, +B/T RRR CTAB nl wob, speaks full sentences No LEE  A 1. ESRD THS RUE AVF [facility] 1. Will be on MWF CoVID shift at [place], logistics beign arranged 2. 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  While on high dose steroids ok to hold his MMF and Pred.  He should review with his nehprologist at [facility] the indication for using IS 3. COVID19 infection on steriods/remdesivir 4. ? HCAP ceftriaxone 5. Acute PE hep gtt 6. AFib hep gtt 7. Rib Fractures 8. Anemia, Hb stable 11s 9. 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 HTN/Vol: UF as able, BP ok. No edema  P . As above HD on schedule with COVID precatuions . Medication Issues; o Preferred narcotic agents for pain control are hydromorphone, fentanyl, and methadone. Morphine should not be used.  o Baclofen should be avoided o Avoid oral sodium phosphate and magnesium citrate based laxatives / bowel preps    Pearson Grippe MD 02/10/2021, 12:16 PM  Recent Labs  Lab 02/08/21 0641 02/09/21 0249 02/10/21 0449  NA 133* 131* 134*  K 4.5 5.1 4.1  CL 97* 95* 96*  CO2 '24 23 24  '$ GLUCOSE 133* 155* 162*  BUN 24* 47* 47*  CREATININE 7.13* 8.44* 6.66*  CALCIUM 9.4 9.2 9.1  PHOS  --  8.3* 6.0*   Recent Labs  Lab 02/08/21 0641 02/09/21 0249 02/10/21 0449  WBC 2.7* 4.1 5.8  NEUTROABS 2.3 3.5 5.3  HGB 11.5* 11.5* 11.5*  HCT 34.8* 34.4* 33.5*  MCV 96.9 95.3 93.3  PLT 127* 126* 127*

## 2021-02-10 NOTE — Progress Notes (Signed)
Renal Navigator requested update from Attending/Dr. Waldron Labs, who states patient could possibly be ready for discharge by Sunday, 5/8. Navigator contacted patient's wife/Dortha Rzepka at [employer] in [place] [phone] to discuss patient's COVID shift at outpatient HD clinic in order for him to treat in isolation for a period of time. (Navigator is unsure of [facility]'s policy for time frame of COVID isolation). Patient's wife was adamant that patient's schedule will not change at the clinic. She reports that he goes to dialysis TTS 6am and that this will remain. She states she works and will not be able to take him at 11:00am. Navigator directed her to call patient's clinic to discuss. She states she will. Navigator will also call clinic to follow up.  Navigator updated Attending and Nephrologist on this conversation and spoke with [facility] CM/W. Estelle Grumbles to see if she thinks [facility] [employer] would be able to transport patient to/from COVID shift. Navigator anticipates that it will be for 21 days total from date of positive test (5/3), so 7 trips starting Monday, 5/9. Navigator will confirm with clinic and contact patient's wife back if this is an option.   Alphonzo Cruise, [place] Renal Navigator [phone]

## 2021-02-10 NOTE — Telephone Encounter (Signed)
Mathew Pierce DOB: Jul 11, 1946 MRN: [id number]   RIDER WAIVER AND RELEASE OF LIABILITY  For purposes of improving physical access to our facilities, [facility] is pleased to partner with third parties to provide [place] patients or other authorized individuals the option of convenient, on-demand ground transportation services (the [employer]") through use of the technology service that enables users to request on-demand ground transportation from independent third-party providers.  By opting to use and accept these [employer], I, the undersigned, hereby agree on behalf of myself, and on behalf of any minor child using the [employer] for whom I am the parent or legal guardian, as follows:  1. Government social research officer provided to me are provided by independent third-party transportation providers who are not [employer] or employees and who are unaffiliated with [employer]. 2. [place] is neither a transportation carrier nor a common or public carrier. 3. [facility] has no control over the quality or safety of the transportation that occurs as a result of the [employer]. 4. [facility] cannot guarantee that any third-party transportation provider will complete any arranged transportation service. 5. [facility] makes no representation, warranty, or guarantee regarding the reliability, timeliness, quality, safety, suitability, or availability of any of the Transport Services or that they will be error free. 6. I fully understand that traveling by vehicle involves risks and dangers of serious bodily injury, including permanent disability, paralysis, and death. I agree, on behalf of myself and on behalf of any minor child using the Transport Services for whom I am the parent or legal guardian, that the entire risk arising out of my use of the [employer] remains solely with me, to the maximum extent permitted under applicable law. 7. The [employer] are provided "as is" and "as available." [facility] disclaims all representations and warranties, express, implied or statutory, not expressly set out in these terms, including the implied warranties of merchantability and fitness for a particular purpose. 8. I hereby waive and release [place], its agents, employees, officers, directors, representatives, insurers, attorneys, assigns, successors, subsidiaries, and affiliates from any and all past, present, or future claims, demands, liabilities, actions, causes of action, or suits of any kind directly or indirectly arising from acceptance and use of the [employer]. 9. I further waive and release [facility] and its affiliates from all present and future liability and responsibility for any injury or death to persons or damages to property caused by or related to the use of the [employer]. 10. I have read this Waiver and Release of Liability, and I understand the terms used in it and their legal significance. This Waiver is freely and voluntarily given with the understanding that my right (as well as the right of any minor child for whom I am the parent or legal guardian using the [employer]) to legal recourse against [name] in connection with the [employer] is knowingly surrendered in return for use of these services.   I attest that I read the consent document to [name], gave [name] the opportunity to ask questions and answered the questions asked (if any). I affirm that [name] then provided consent for he's participation in this program.     Mathew Pierce (Wife Mathew Pierce took attestation on [employer])

## 2021-02-10 NOTE — Progress Notes (Signed)
ANTICOAGULATION CONSULT NOTE - Follow Up Consult  Pharmacy Consult for IV Heparin >> apixaban  Indication: possible PE and new afib  No Known Allergies  Patient Measurements: Height: '5\' 7"'$  (170.2 cm) Weight: 83.3 kg (183 lb 10.3 oz) IBW/kg (Calculated) : 66.1 Heparin Dosing Weight:  79.4 kg  Vital Signs: Temp: 98.4 F (36.9 C) (05/06 0350) Temp Source: Oral (05/06 0350) BP: 104/61 (05/06 0600) Pulse Rate: 53 (05/06 0600)  Labs: Recent Labs    02/07/21 1711 02/07/21 1733 02/07/21 2018 02/08/21 0641 02/08/21 1610 02/09/21 0249 02/09/21 1204 02/10/21 0449  HGB 12.6*   < >  --  11.5*  --  11.5*  --  11.5*  HCT 38.4*   < >  --  34.8*  --  34.4*  --  33.5*  PLT 125*  --   --  127*  --  126*  --  127*  HEPARINUNFRC  --   --   --  0.15*   < > 0.79* 0.65 >1.10*  CREATININE  --    < >  --  7.13*  --  8.44*  --  6.66*  TROPONINIHS 253*  --  298*  --   --   --   --   --    < > = values in this interval not displayed.    Estimated Creatinine Clearance: 9.9 mL/min (A) (by C-G formula based on SCr of 6.66 mg/dL (H)).  Assessment: 75 yo M with possible acute PE on CT scan and new afib, also COVID+. Dopplers negative for LE DVT.  Pt with ESRD, on HD; he reports that he does not get anticoagulation during HD due to GIB (?), so will dose heparin cautiously. D-dimer 2.49 on admit. No anticoagulation prior to admission. Pharmacy consulted for heparin switch to apixaban.  Heparin level >1.1 on 1150 units/hr. H/H, plt stable. Possible lab error.  Will switch to apixaban afib dose per MD given age-indeterminant PE and ESRD.    Goal of Therapy:  Heparin level 0.3-0.7 units/ml Monitor platelets by anticoagulation protocol: Yes   Plan:  Stop heparin Start apixaban '5mg'$  BID Monitor for signs/symptoms of bleeding    Benetta Spar, PharmD, BCPS, BCCP Clinical Pharmacist  Please check AMION for all [place] phone numbers After 10:00 PM, call [place] [phone]

## 2021-02-10 NOTE — TOC Benefit Eligibility Note (Signed)
Transition of Care [facility]) Benefit Eligibility Note    Patient Details  Name: Mathew Pierce MRN: [id number] Date of Birth: Aug 26, 1946   Medication/Dose: Eliquis '5mg'$ . bid for 30 day supply  Covered?: Yes  Tier: 3 Drug  Prescription Coverage [facility]erred Pharmacy[facility]  Spoke with Person/Company/Phone Number:: Per Bubba Hales L. Wynne Ph# [phone]  Co-Pay: $9.85  Prior Approval: No  Deductible:  (no deductible)       Shelda Altes Phone Number: 02/10/2021, 3:55 PM

## 2021-02-10 NOTE — Progress Notes (Signed)
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 Hazy airspace opacity in the posterior basal segments of both lungs favoring atelectasis over pneumonia. Currently we do not show the peripheral ground-glass opacity scattered in the lungs characteristic of COVID pneumonia. Musculoskeletal: There are acute fractures of the left seventh, eighth, and ninth ribs posterolaterally. 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 Bridging spurring anteriorly through most of the thoracic spine. Chronic anterior wedging at T12. Review of the MIP images confirms the above findings. CT ABDOMEN and PELVIS FINDINGS Hepatobiliary: Faint heterogeneity in the liver potentially from hepatic congestion no compelling  morphology of cirrhosis. Correlate with liver enzymes and potentially hepatitis panel given the heterogeneous enhancement in the liver. High density in the indistinct and blurred gallbladder, possibly from gallstones. No biliary dilatation. Pancreas: Unremarkable Spleen: Unremarkable Adrenals/Urinary Tract: Both adrenal glands appear normal. Severe bilateral renal atrophy. 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 Stomach/Bowel: Unremarkable Vascular/Lymphatic: Prominent atherosclerotic vascular calcification. No pathologic adenopathy identified. Reproductive: Unremarkable Other: Small amount of ascites. Mild mesenteric edema and mild subcutaneous edema. 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 Review of the MIP images confirms the above findings. IMPRESSION: 1. Questionable subsegmental filling defect in a branch of the right lower lobe posterior basal segment pulmonary artery. This could well be from chronic pulmonary embolus given the somewhat peripheral location, but subsegmental acute pulmonary embolus is difficult to completely exclude. No segmental or lobar pulmonary embolus is identified. 2. Moderate cardiomegaly with extensive atherosclerosis throughout the chest, abdomen, and pelvis. 3. 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 4. Small right and trace left pleural effusions. Minimal bibasilar atelectasis. 5. Acute fractures of the left seventh, eighth, and ninth ribs posterolaterally. 6. 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 7. 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 8. Small amount of ascites with diffuse mesenteric and subcutaneous edema compatible with third spacing of fluid. Electronically Signed   By: Van Clines M.D.   On: 02/07/2021 21:12   DG Chest Portable 1  View  Result Date: 02/07/2021 CLINICAL DATA:  Left-sided pleuritic chest pain, hypoxia, short of breath EXAM: PORTABLE CHEST 1 VIEW COMPARISON:  04/22/2020 FINDINGS: Single frontal view of the chest demonstrates stable enlargement of the cardiac silhouette. Increased density in the retrocardiac region may reflect left lower lobe airspace disease or atelectasis. Small left pleural effusion versus pleural thickening obscures the lateral costophrenic angle. The right chest is clear. No pneumothorax. Stable vascular stent right subclavian region. No acute bony abnormalities. IMPRESSION: 1. Retrocardiac left lower lobe consolidation and small left pleural effusion. Findings are consistent with left lower lobe pneumonia. Electronically Signed   By: Randa Ngo M.D.   On: 02/07/2021 18:30   VAS [place] LOWER EXTREMITY VENOUS (DVT)  Result Date: 02/08/2021  Lower Venous DVT Study Patient Name:  Mathew Pierce Hum  Date of Exam:   02/08/2021 Medical Rec #: [id number]      Accession #:    [id number] Date of Birth: [birth year]      Patient Gender: M Patient Age:   69Y Exam Location:  [facility] Procedure:      VAS [place] LOWER EXTREMITY VENOUS (DVT) Referring Phys: [id number] RONDELL A SMITH --------------------------------------------------------------------------------  Indications: Pulmonary embolism. Other Indications: COVID+. Comparison Study: No previous exams Performing Technologist: Rogelia Rohrer  Examination Guidelines: A complete evaluation includes B-mode imaging, spectral Doppler, color Doppler, and power Doppler as needed  of all accessible portions of each vessel. Bilateral testing is considered an integral part of a complete examination. Limited examinations for reoccurring indications may be performed as noted. The reflux portion of the exam is performed with the patient in reverse Trendelenburg.  +---------+---------------+---------+-----------+----------+--------------+ RIGHT     CompressibilityPhasicitySpontaneityPropertiesThrombus Aging +---------+---------------+---------+-----------+----------+--------------+ CFV      Full           Yes      Yes                                 +---------+---------------+---------+-----------+----------+--------------+ SFJ      Full                                                        +---------+---------------+---------+-----------+----------+--------------+ FV Prox  Full           Yes      Yes                                 +---------+---------------+---------+-----------+----------+--------------+ FV Mid   Full           Yes      Yes                                 +---------+---------------+---------+-----------+----------+--------------+ FV DistalFull           Yes      Yes                                 +---------+---------------+---------+-----------+----------+--------------+ PFV      Full                                                        +---------+---------------+---------+-----------+----------+--------------+ POP      Full           Yes      Yes                                 +---------+---------------+---------+-----------+----------+--------------+ PTV      Full                                                        +---------+---------------+---------+-----------+----------+--------------+ PERO     Full                                                        +---------+---------------+---------+-----------+----------+--------------+   +---------+---------------+---------+-----------+----------+--------------+ LEFT     CompressibilityPhasicitySpontaneityPropertiesThrombus Aging +---------+---------------+---------+-----------+----------+--------------+ CFV      Full  Yes      Yes                                 +---------+---------------+---------+-----------+----------+--------------+ SFJ      Full                                                         +---------+---------------+---------+-----------+----------+--------------+ FV Prox  Full           Yes      Yes                                 +---------+---------------+---------+-----------+----------+--------------+ FV Mid   Full           Yes      Yes                                 +---------+---------------+---------+-----------+----------+--------------+ FV DistalFull           Yes      Yes                                 +---------+---------------+---------+-----------+----------+--------------+ PFV      Full                                                        +---------+---------------+---------+-----------+----------+--------------+ POP      Full           Yes      Yes                                 +---------+---------------+---------+-----------+----------+--------------+ PTV      Full                                                        +---------+---------------+---------+-----------+----------+--------------+ PERO     Full                                                        +---------+---------------+---------+-----------+----------+--------------+     Summary: BILATERAL: - No evidence of deep vein thrombosis seen in the lower extremities, bilaterally. - No evidence of superficial venous thrombosis in the lower extremities, bilaterally. -No evidence of popliteal cyst, bilaterally.   *See table(s) above for measurements and observations. Electronically signed by Jamelle Haring on 02/08/2021 at 5:03:00 PM.    Final    ECHOCARDIOGRAM LIMITED  Result Date: 02/08/2021    ECHOCARDIOGRAM LIMITED REPORT   Patient Name:   Mathew Pierce Date of Exam: 02/08/2021 Medical Rec #:  [id number]     Height:       67.0 in Accession #:    [id number]    Weight:       175.0 lb Date of Birth:  [birth year]     BSA:          1.911 m Patient Age:    25 years      BP:           138/78 mmHg Patient Gender: M             HR:           70 bpm. Exam Location:   Inpatient Procedure: Limited Echo, Cardiac Doppler and Color Doppler Indications:    Atrial fibrillation  History:        Patient has no prior history of Echocardiogram examinations.                 Risk Factors:Diabetes and Hypertension. ESRD. COVID-19.  Sonographer:    Clayton Lefort RDCS (AE) Referring Phys: [id number] [facility] A SMITH  Sonographer Comments: COVID-19 IMPRESSIONS  1. Left ventricular ejection fraction, by estimation, is 25%. The left ventricle demonstrates global hypokinesis. The left ventricular internal cavity size was moderately to severely dilated. There is moderate left ventricular hypertrophy.  2. Right ventricular systolic function is moderately reduced. The right ventricular size is moderately enlarged. There is normal pulmonary artery systolic pressure.  3. Left atrial size was moderately dilated.  4. Right atrial size was moderately dilated.  5. The pericardial effusion is localized near the right atrium and lateral to the left ventricle.  6. The mitral valve is abnormal. Trivial mitral valve regurgitation. Severe mitral annular calcification.  7. Tricuspid valve regurgitation is moderate.  8. AV calcified likely some degree of stenosis given mean gradient of 6 with severely reduced EF but DVI normal 0.95 and AVA calculates at 2.8 cm2. FINDINGS  Left Ventricle: Left ventricular ejection fraction, by estimation, is 25%. The left ventricle demonstrates global hypokinesis. The left ventricular internal cavity size was moderately to severely dilated. There is moderate left ventricular hypertrophy. Right Ventricle: The right ventricular size is moderately enlarged. Right ventricular systolic function is moderately reduced. There is normal pulmonary artery systolic pressure. The tricuspid regurgitant velocity is 2.58 m/s, and with an assumed right atrial pressure of 8 mmHg, the estimated right ventricular systolic pressure is [id number] mmHg. Left Atrium: Left atrial size was moderately dilated. Right  Atrium: Right atrial size was moderately dilated. Pericardium: Trivial pericardial effusion is present. The pericardial effusion is localized near the right atrium and lateral to the left ventricle. Mitral Valve: The mitral valve is abnormal. There is moderate thickening of the mitral valve leaflet(s). There is moderate calcification of the mitral valve leaflet(s). Severe mitral annular calcification. Trivial mitral valve regurgitation. Tricuspid Valve: Tricuspid valve regurgitation is moderate. Aortic Valve: AV calcified likely some degree of stenosis given mean gradient of 6 with severely reduced EF but DVI normal 0.95 and AVA calculates at 2.8 cm2. Aortic valve mean gradient measures 6.0 mmHg. Aortic valve peak gradient measures 9.6 mmHg. Aortic valve area, by VTI measures 3.30 cm. LEFT VENTRICLE PLAX 2D LVIDd:         6.10 cm LVIDs:         5.50 cm LV PW:         1.50 cm LV IVS:        1.50 cm LVOT diam:     2.10 cm LV SV:  91 LV SV Index:   48 LVOT Area:     3.46 cm  IVC IVC diam: 1.10 cm LEFT ATRIUM         Index LA diam:    4.70 cm 2.46 cm/m  AORTIC VALVE AV Area (Vmax):    3.28 cm AV Area (Vmean):   2.85 cm AV Area (VTI):     3.30 cm AV Vmax:           155.00 cm/s AV Vmean:          116.000 cm/s AV VTI:            0.277 m AV Peak Grad:      9.6 mmHg AV Mean Grad:      6.0 mmHg LVOT Vmax:         147.00 cm/s LVOT Vmean:        95.600 cm/s LVOT VTI:          0.264 m LVOT/AV VTI ratio: 0.95  AORTA Ao Root diam: 3.30 cm Ao Asc diam:  3.10 cm TRICUSPID VALVE TR Peak grad:   26.6 mmHg TR Vmax:        258.00 cm/s  SHUNTS Systemic VTI:  0.26 m Systemic Diam: 2.10 cm Jenkins Rouge MD Electronically signed by Jenkins Rouge MD Signature Date/Time: 02/08/2021/5:01:00 PM    Final

## 2021-02-11 DIAGNOSIS — U071 COVID-19: Secondary | ICD-10-CM | POA: Diagnosis not present

## 2021-02-11 DIAGNOSIS — I2699 Other pulmonary embolism without acute cor pulmonale: Secondary | ICD-10-CM | POA: Diagnosis not present

## 2021-02-11 DIAGNOSIS — J9601 Acute respiratory failure with hypoxia: Secondary | ICD-10-CM | POA: Diagnosis not present

## 2021-02-11 DIAGNOSIS — I4891 Unspecified atrial fibrillation: Secondary | ICD-10-CM | POA: Diagnosis not present

## 2021-02-11 LAB — COMPREHENSIVE METABOLIC PANEL
ALT: 11 U/L (ref 0–44)
AST: 22 U/L (ref 15–41)
Albumin: 2.5 g/dL — ABNORMAL LOW (ref 3.5–5.0)
Alkaline Phosphatase: 104 U/L (ref 38–126)
Anion gap: 17 — ABNORMAL HIGH (ref 5–15)
BUN: 75 mg/dL — ABNORMAL HIGH (ref 8–23)
CO2: 22 mmol/L (ref 22–32)
Calcium: 9.5 mg/dL (ref 8.9–10.3)
Chloride: 95 mmol/L — ABNORMAL LOW (ref 98–111)
Creatinine, Ser: 7.92 mg/dL — ABNORMAL HIGH (ref 0.61–1.24)
GFR, Estimated: 7 mL/min — ABNORMAL LOW (ref >60)
Glucose, Bld: 157 mg/dL — ABNORMAL HIGH (ref 70–99)
Potassium: 4.7 mmol/L (ref 3.5–5.1)
Sodium: 134 mmol/L — ABNORMAL LOW (ref 135–145)
Total Bilirubin: 1.2 mg/dL (ref 0.3–1.2)
Total Protein: 6.7 g/dL (ref 6.5–8.1)

## 2021-02-11 LAB — FERRITIN: Ferritin: 1670 ng/mL — ABNORMAL HIGH (ref 24–336)

## 2021-02-11 LAB — GLUCOSE, CAPILLARY
Glucose-Capillary: 108 mg/dL — ABNORMAL HIGH (ref 70–99)
Glucose-Capillary: 142 mg/dL — ABNORMAL HIGH (ref 70–99)
Glucose-Capillary: 144 mg/dL — ABNORMAL HIGH (ref 70–99)
Glucose-Capillary: 154 mg/dL — ABNORMAL HIGH (ref 70–99)
Glucose-Capillary: 155 mg/dL — ABNORMAL HIGH (ref 70–99)
Glucose-Capillary: 161 mg/dL — ABNORMAL HIGH (ref 70–99)

## 2021-02-11 LAB — CBC WITH DIFFERENTIAL/PLATELET
Abs Immature Granulocytes: 0.03 10*3/uL (ref 0.00–0.07)
Basophils Absolute: 0 10*3/uL (ref 0.0–0.1)
Basophils Relative: 0 %
Eosinophils Absolute: 0 10*3/uL (ref 0.0–0.5)
Eosinophils Relative: 0 %
HCT: 36 % — ABNORMAL LOW (ref 39.0–52.0)
Hemoglobin: 12.1 g/dL — ABNORMAL LOW (ref 13.0–17.0)
Immature Granulocytes: 1 %
Lymphocytes Relative: 5 %
Lymphs Abs: 0.3 10*3/uL — ABNORMAL LOW (ref 0.7–4.0)
MCH: 31.9 pg (ref 26.0–34.0)
MCHC: 33.6 g/dL (ref 30.0–36.0)
MCV: 95 fL (ref 80.0–100.0)
Monocytes Absolute: 0.2 10*3/uL (ref 0.1–1.0)
Monocytes Relative: 3 %
Neutro Abs: 5.3 10*3/uL (ref 1.7–7.7)
Neutrophils Relative %: 91 %
Platelets: 143 10*3/uL — ABNORMAL LOW (ref 150–400)
RBC: 3.79 MIL/uL — ABNORMAL LOW (ref 4.22–5.81)
RDW: 16 % — ABNORMAL HIGH (ref 11.5–15.5)
WBC: 5.7 10*3/uL (ref 4.0–10.5)
nRBC: 0.3 % — ABNORMAL HIGH (ref 0.0–0.2)

## 2021-02-11 LAB — PHOSPHORUS: Phosphorus: 7.3 mg/dL — ABNORMAL HIGH (ref 2.5–4.6)

## 2021-02-11 LAB — C-REACTIVE PROTEIN: CRP: 3.1 mg/dL — ABNORMAL HIGH (ref <1.0)

## 2021-02-11 LAB — HEPARIN LEVEL (UNFRACTIONATED): Heparin Unfractionated: >1.1 IU/mL — ABNORMAL HIGH (ref 0.30–0.70)

## 2021-02-11 LAB — MAGNESIUM: Magnesium: 2 mg/dL (ref 1.7–2.4)

## 2021-02-11 LAB — D-DIMER, QUANTITATIVE: D-Dimer, Quant: 1.33 ug/mL-FEU — ABNORMAL HIGH (ref 0.00–0.50)

## 2021-02-11 MED ORDER — CARVEDILOL 3.125 MG PO TABS
3.1250 mg | ORAL_TABLET | Freq: Two times a day (BID) | ORAL | Status: DC
  Administered 2021-02-11 (×2): 3.125 mg via ORAL
  Filled 2021-02-11 (×3): qty 1

## 2021-02-11 NOTE — Progress Notes (Signed)
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 corresponding thin-section images. Coronary, aortic arch, and branch vessel atherosclerotic vascular disease. Right axillary vein stent. Moderate cardiomegaly with coronary artery atherosclerotic vascular calcification. Prominent hepatic vein likely from poor right heart function. Mediastinum/Nodes: AP window lymph node 0.8 cm in short axis, within normal limits. No pathologic adenopathy identified. Lungs/Pleura: Small right and trace left pleural effusions. Hazy airspace opacity in the posterior basal segments of both lungs favoring atelectasis over pneumonia. Currently we do not show the peripheral ground-glass opacity scattered in the lungs characteristic of COVID  pneumonia. Musculoskeletal: There are acute fractures of the left seventh, eighth, and ninth ribs posterolaterally. 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 Bridging spurring anteriorly through most of the thoracic spine. Chronic anterior wedging at T12. Review of the MIP images confirms the above findings. CT ABDOMEN and PELVIS FINDINGS Hepatobiliary: Faint heterogeneity in the liver potentially from hepatic congestion no compelling morphology of cirrhosis. Correlate with liver enzymes and potentially hepatitis panel given the heterogeneous enhancement in the liver. High density in the indistinct and blurred gallbladder, possibly from gallstones. No biliary dilatation. Pancreas: Unremarkable Spleen: Unremarkable Adrenals/Urinary Tract: Both adrenal glands appear normal. Severe bilateral renal atrophy. 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 Stomach/Bowel: Unremarkable Vascular/Lymphatic: Prominent atherosclerotic vascular calcification. No pathologic adenopathy identified. Reproductive: Unremarkable Other: Small amount of ascites. Mild mesenteric edema and mild subcutaneous edema. 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 Review of the MIP images confirms the above findings. IMPRESSION: 1. Questionable subsegmental filling defect in a branch of the right lower lobe posterior basal segment pulmonary artery. This could well be from chronic pulmonary embolus given the somewhat peripheral location, but subsegmental acute pulmonary embolus is difficult to completely exclude. No segmental or lobar pulmonary embolus is identified. 2. Moderate cardiomegaly with extensive atherosclerosis throughout the chest, abdomen, and pelvis. 3. 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 4. Small right and trace left pleural effusions. Minimal  bibasilar atelectasis. 5. Acute fractures of the left seventh, eighth, and ninth ribs posterolaterally. 6. 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 7. 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 8. Small amount of ascites with diffuse mesenteric and subcutaneous edema compatible with third spacing of fluid. Electronically Signed   By: Van Clines M.D.   On: 02/07/2021 21:12   DG Chest Portable 1 View  Result Date: 02/07/2021 CLINICAL DATA:  Left-sided pleuritic chest pain, hypoxia, short of breath EXAM: PORTABLE CHEST 1 VIEW COMPARISON:  04/22/2020 FINDINGS: Single frontal view of the chest demonstrates stable enlargement of the cardiac silhouette. Increased density in the retrocardiac region may reflect left lower lobe airspace disease or atelectasis. Small left pleural effusion versus pleural thickening obscures the lateral costophrenic angle. The right chest is clear. No pneumothorax. Stable vascular stent right subclavian region. No acute bony abnormalities. IMPRESSION: 1. Retrocardiac left lower lobe consolidation and small left pleural effusion. Findings are consistent with left lower lobe pneumonia. Electronically Signed   By: Randa Ngo M.D.   On: 02/07/2021 18:30   VAS [place] LOWER EXTREMITY VENOUS (DVT)  Result Date: 02/08/2021  Lower Venous DVT Study Patient Name:  KASRA HELLBERG Bozard  Date of Exam:   02/08/2021 Medical Rec #:  [id number]      Accession #:    [id number] Date of Birth: 1946/03/17      Patient Gender: M Patient Age:   16Y Exam Location:  [facility] Procedure:      VAS [place] LOWER EXTREMITY VENOUS (DVT) Referring Phys: [id number] RONDELL A SMITH --------------------------------------------------------------------------------  Indications: Pulmonary embolism. Other Indications: COVID+. Comparison Study: No previous exams Performing Technologist: Rogelia Rohrer  Examination Guidelines: A complete evaluation includes B-mode imaging, spectral Doppler,  color Doppler, and power Doppler as needed of all accessible portions of each vessel. Bilateral testing is considered an integral part of a complete examination. Limited examinations for reoccurring indications may be performed as noted. The reflux portion of the exam is performed with the patient in reverse Trendelenburg.  +---------+---------------+---------+-----------+----------+--------------+ RIGHT    CompressibilityPhasicitySpontaneityPropertiesThrombus Aging +---------+---------------+---------+-----------+----------+--------------+ CFV      Full           Yes      Yes                                 +---------+---------------+---------+-----------+----------+--------------+ SFJ      Full                                                        +---------+---------------+---------+-----------+----------+--------------+ FV Prox  Full           Yes      Yes                                 +---------+---------------+---------+-----------+----------+--------------+ FV Mid   Full           Yes      Yes                                 +---------+---------------+---------+-----------+----------+--------------+ FV DistalFull           Yes      Yes                                 +---------+---------------+---------+-----------+----------+--------------+ PFV      Full                                                        +---------+---------------+---------+-----------+----------+--------------+ POP      Full           Yes      Yes                                 +---------+---------------+---------+-----------+----------+--------------+ PTV      Full                                                        +---------+---------------+---------+-----------+----------+--------------+  PERO     Full                                                        +---------+---------------+---------+-----------+----------+--------------+    +---------+---------------+---------+-----------+----------+--------------+ LEFT     CompressibilityPhasicitySpontaneityPropertiesThrombus Aging +---------+---------------+---------+-----------+----------+--------------+ CFV      Full           Yes      Yes                                 +---------+---------------+---------+-----------+----------+--------------+ SFJ      Full                                                        +---------+---------------+---------+-----------+----------+--------------+ FV Prox  Full           Yes      Yes                                 +---------+---------------+---------+-----------+----------+--------------+ FV Mid   Full           Yes      Yes                                 +---------+---------------+---------+-----------+----------+--------------+ FV DistalFull           Yes      Yes                                 +---------+---------------+---------+-----------+----------+--------------+ PFV      Full                                                        +---------+---------------+---------+-----------+----------+--------------+ POP      Full           Yes      Yes                                 +---------+---------------+---------+-----------+----------+--------------+ PTV      Full                                                        +---------+---------------+---------+-----------+----------+--------------+ PERO     Full                                                        +---------+---------------+---------+-----------+----------+--------------+  Summary: BILATERAL: - No evidence of deep vein thrombosis seen in the lower extremities, bilaterally. - No evidence of superficial venous thrombosis in the lower extremities, bilaterally. -No evidence of popliteal cyst, bilaterally.   *See table(s) above for measurements and observations. Electronically signed by Jamelle Haring on 02/08/2021 at  5:03:00 PM.    Final    ECHOCARDIOGRAM LIMITED  Result Date: 02/08/2021    ECHOCARDIOGRAM LIMITED REPORT   Patient Name:   ILIYA JOSEPHSON Boshers Date of Exam: 02/08/2021 Medical Rec #:  [id number]     Height:       67.0 in Accession #:    [id number]    Weight:       175.0 lb Date of Birth:  11/12/1945     BSA:          1.911 m Patient Age:    60 years      BP:           138/78 mmHg Patient Gender: M             HR:           70 bpm. Exam Location:  Inpatient Procedure: Limited Echo, Cardiac Doppler and Color Doppler Indications:    Atrial fibrillation  History:        Patient has no prior history of Echocardiogram examinations.                 Risk Factors:Diabetes and Hypertension. ESRD. COVID-19.  Sonographer:    Clayton Lefort RDCS (AE) Referring Phys: [id number] [facility] A SMITH  Sonographer Comments: COVID-19 IMPRESSIONS  1. Left ventricular ejection fraction, by estimation, is 25%. The left ventricle demonstrates global hypokinesis. The left ventricular internal cavity size was moderately to severely dilated. There is moderate left ventricular hypertrophy.  2. Right ventricular systolic function is moderately reduced. The right ventricular size is moderately enlarged. There is normal pulmonary artery systolic pressure.  3. Left atrial size was moderately dilated.  4. Right atrial size was moderately dilated.  5. The pericardial effusion is localized near the right atrium and lateral to the left ventricle.  6. The mitral valve is abnormal. Trivial mitral valve regurgitation. Severe mitral annular calcification.  7. Tricuspid valve regurgitation is moderate.  8. AV calcified likely some degree of stenosis given mean gradient of 6 with severely reduced EF but DVI normal 0.95 and AVA calculates at 2.8 cm2. FINDINGS  Left Ventricle: Left ventricular ejection fraction, by estimation, is 25%. The left ventricle demonstrates global hypokinesis. The left ventricular internal cavity size was moderately to severely dilated. There is  moderate left ventricular hypertrophy. Right Ventricle: The right ventricular size is moderately enlarged. Right ventricular systolic function is moderately reduced. There is normal pulmonary artery systolic pressure. The tricuspid regurgitant velocity is 2.58 m/s, and with an assumed right atrial pressure of 8 mmHg, the estimated right ventricular systolic pressure is [id number] mmHg. Left Atrium: Left atrial size was moderately dilated. Right Atrium: Right atrial size was moderately dilated. Pericardium: Trivial pericardial effusion is present. The pericardial effusion is localized near the right atrium and lateral to the left ventricle. Mitral Valve: The mitral valve is abnormal. There is moderate thickening of the mitral valve leaflet(s). There is moderate calcification of the mitral valve leaflet(s). Severe mitral annular calcification. Trivial mitral valve regurgitation. Tricuspid Valve: Tricuspid valve regurgitation is moderate. Aortic Valve: AV calcified likely some degree of stenosis given mean gradient of 6 with severely reduced EF but DVI normal 0.95 and AVA calculates at 2.8  cm2. Aortic valve mean gradient measures 6.0 mmHg. Aortic valve peak gradient measures 9.6 mmHg. Aortic valve area, by VTI measures 3.30 cm. LEFT VENTRICLE PLAX 2D LVIDd:         6.10 cm LVIDs:         5.50 cm LV PW:         1.50 cm LV IVS:        1.50 cm LVOT diam:     2.10 cm LV SV:         91 LV SV Index:   48 LVOT Area:     3.46 cm  IVC IVC diam: 1.10 cm LEFT ATRIUM         Index LA diam:    4.70 cm 2.46 cm/m  AORTIC VALVE AV Area (Vmax):    3.28 cm AV Area (Vmean):   2.85 cm AV Area (VTI):     3.30 cm AV Vmax:           155.00 cm/s AV Vmean:          116.000 cm/s AV VTI:            0.277 m AV Peak Grad:      9.6 mmHg AV Mean Grad:      6.0 mmHg LVOT Vmax:         147.00 cm/s LVOT Vmean:        95.600 cm/s LVOT VTI:          0.264 m LVOT/AV VTI ratio: 0.95  AORTA Ao Root diam: 3.30 cm Ao Asc diam:  3.10 cm TRICUSPID VALVE TR Peak  grad:   26.6 mmHg TR Vmax:        258.00 cm/s  SHUNTS Systemic VTI:  0.26 m Systemic Diam: 2.10 cm Jenkins Rouge MD Electronically signed by Jenkins Rouge MD Signature Date/Time: 02/08/2021/5:01:00 PM    Final

## 2021-02-11 NOTE — Progress Notes (Signed)
Admit: 02/07/2021 LOS: 3  57M ESRD THS [place] RUE AVF with PNA, COVID19 infection, rib fractures, AFib with RVR, new PE  Subjective:  . No new c/o . For HD today  05/06 0701 - 05/07 0700 In: 480 [P.O.:480] Out: -   Filed Weights   02/09/21 1704 02/10/21 0543 02/11/21 0100  Weight: 81.5 kg 83.3 kg 81.7 kg    Scheduled Meds: . albuterol  2 puff Inhalation Q6H  . apixaban  5 mg Oral BID  . vitamin C  500 mg Oral Daily  . aspirin EC  81 mg Oral Daily  . carvedilol  3.125 mg Oral BID  . Chlorhexidine Gluconate Cloth  6 each Topical Q0600  . doxycycline  100 mg Oral Q12H  . feeding supplement  237 mL Oral BID BM  . insulin aspart  0-9 Units Subcutaneous Q4H  . insulin detemir  0.1 Units/kg Subcutaneous BID  . lidocaine  1 patch Transdermal Q24H  . linagliptin  5 mg Oral Daily  . lisinopril  5 mg Oral Daily  . magnesium oxide  400 mg Oral Daily  . pantoprazole  40 mg Oral BID  . pneumococcal 23 valent vaccine  0.5 mL Intramuscular Tomorrow-1000  . predniSONE  50 mg Oral Daily  . zinc sulfate  220 mg Oral Daily   Continuous Infusions: . sodium chloride Stopped (02/07/21 2257)  . sodium chloride Stopped (02/07/21 2257)  . sodium chloride    . sodium chloride    . cefTRIAXone (ROCEPHIN)  IV 2 g (02/10/21 0910)  . remdesivir 100 mg in NS 100 mL 100 mg (02/10/21 0912)   PRN Meds:.sodium chloride, sodium chloride, sodium chloride, sodium chloride, acetaminophen, chlorpheniramine-HYDROcodone, guaiFENesin-dextromethorphan, HYDROcodone-acetaminophen, lidocaine (PF), lidocaine-prilocaine, ondansetron **OR** ondansetron (ZOFRAN) IV, pentafluoroprop-tetrafluoroeth, prochlorperazine  Current Labs: reviewed    Physical Exam:  Blood pressure 117/62, pulse (!) 45, temperature 98.3 F (36.8 C), temperature source Oral, resp. rate 11, height '5\' 7"'$  (1.702 m), weight 81.7 kg, SpO2 95 %. NAD RUE AVF aneurysma/tortuous, +B/T RRR CTAB nl wob, speaks full sentences No LEE  A 1. ESRD  THS RUE AVF [facility] 1. Will be on MWF CoVID shift at [place], logistics beign arranged 2. HD on schedule today and then transition to MWF: 3h, 2K, 450/800, No heparin 2. 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  While on high dose steroids ok to hold his MMF and Pred.  He should review with his nehprologist at [place] the indication for using IS 3. COVID19 infection on steriods/remdesivir, per [facility] 4. ? HCAP ceftriaxone 5. Acute PE transition to apixaban 6. AFib hep gtt 7. Rib Fractures 8. Anemia, Hb stable > 10 9. 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 HTN/Vol: UF as able, BP ok. No edema  P . As above HD on schedule with COVID precatuions . Medication Issues; o Preferred narcotic agents for pain control are hydromorphone, fentanyl, and methadone. Morphine should not be used.  o Baclofen should be avoided o Avoid oral sodium phosphate and magnesium citrate based laxatives / bowel preps    Pearson Grippe MD 02/11/2021, 9:18 AM  Recent Labs  Lab 02/09/21 0249 02/10/21 0449 02/11/21 0328  NA 131* 134* 134*  K 5.1 4.1 4.7  CL 95* 96* 95*  CO2 '23 24 22  '$ GLUCOSE 155* 162* 157*  BUN 47* 47* 75*  CREATININE  8.44* 6.66* 7.92*  CALCIUM 9.2 9.1 9.5  PHOS 8.3* 6.0* 7.3*   Recent Labs  Lab 02/09/21 0249 02/10/21 0449 02/11/21 0328  WBC 4.1 5.8 5.7  NEUTROABS 3.5 5.3 5.3  HGB 11.5* 11.5* 12.1*  HCT 34.4* 33.5* 36.0*  MCV 95.3 93.3 95.0  PLT 126* 127* 143*

## 2021-02-11 NOTE — Plan of Care (Signed)
  Problem: Education: Goal: Knowledge of risk factors and measures for prevention of condition will improve Outcome: Progressing   Problem: Coping: Goal: Psychosocial and spiritual needs will be supported Outcome: Progressing   Problem: Respiratory: Goal: Will maintain a patent airway Outcome: Progressing Goal: Complications related to the disease process, condition or treatment will be avoided or minimized Outcome: Progressing   Problem: Clinical Measurements: Goal: Ability to maintain clinical measurements within normal limits will improve Outcome: Progressing Goal: Will remain free from infection Outcome: Progressing Goal: Diagnostic test results will improve Outcome: Progressing   Problem: Activity: Goal: Risk for activity intolerance will decrease Outcome: Progressing   Problem: Pain Managment: Goal: General experience of comfort will improve Outcome: Progressing   Problem: Safety: Goal: Ability to remain free from injury will improve Outcome: Progressing   Problem: Skin Integrity: Goal: Risk for impaired skin integrity will decrease Outcome: Progressing

## 2021-02-11 NOTE — Progress Notes (Signed)
CCMD notified writer of patient having extreme brady episodes with rates in high 30s.   Patient assessed and had no complaints of chest pain, palpitations or syncope.   BP WNL.  MD notified.  No new orders at this time and will continue to monitor.

## 2021-02-11 NOTE — Progress Notes (Signed)
SATURATION QUALIFICATIONS: (This note is used to comply with regulatory documentation for home oxygen)  Patient Saturations on Room Air at Rest = 91%  Patient Saturations on Room Air while Ambulating = 89%  Patient Saturations on 2 Liters of oxygen while Ambulating = 90%  Please briefly explain why patient needs home oxygen:  He does not at this time, but will need frequent oxygen sat. monitoring when possible.  He stated to this nurse that " I wouldn't use it even if it were at the house.  I've had it before and never used it."  Refusing to listen to rational for IS.  Does C&DB, but because of left sided rib pain, he is cautious about "over-doing it".
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
## 2021-02-12 DIAGNOSIS — I2699 Other pulmonary embolism without acute cor pulmonale: Secondary | ICD-10-CM | POA: Diagnosis not present

## 2021-02-12 DIAGNOSIS — S2242XA Multiple fractures of ribs, left side, initial encounter for closed fracture: Secondary | ICD-10-CM | POA: Diagnosis not present

## 2021-02-12 DIAGNOSIS — I4891 Unspecified atrial fibrillation: Secondary | ICD-10-CM | POA: Diagnosis not present

## 2021-02-12 DIAGNOSIS — U071 COVID-19: Secondary | ICD-10-CM | POA: Diagnosis not present

## 2021-02-12 LAB — D-DIMER, QUANTITATIVE: D-Dimer, Quant: 1.48 ug/mL-FEU — ABNORMAL HIGH (ref 0.00–0.50)

## 2021-02-12 LAB — COMPREHENSIVE METABOLIC PANEL
ALT: 12 U/L (ref 0–44)
AST: 19 U/L (ref 15–41)
Albumin: 2.4 g/dL — ABNORMAL LOW (ref 3.5–5.0)
Alkaline Phosphatase: 94 U/L (ref 38–126)
Anion gap: 15 (ref 5–15)
BUN: 50 mg/dL — ABNORMAL HIGH (ref 8–23)
CO2: 24 mmol/L (ref 22–32)
Calcium: 9.3 mg/dL (ref 8.9–10.3)
Chloride: 96 mmol/L — ABNORMAL LOW (ref 98–111)
Creatinine, Ser: 5.86 mg/dL — ABNORMAL HIGH (ref 0.61–1.24)
GFR, Estimated: 9 mL/min — ABNORMAL LOW (ref >60)
Glucose, Bld: 124 mg/dL — ABNORMAL HIGH (ref 70–99)
Potassium: 4.2 mmol/L (ref 3.5–5.1)
Sodium: 135 mmol/L (ref 135–145)
Total Bilirubin: 0.8 mg/dL (ref 0.3–1.2)
Total Protein: 6.5 g/dL (ref 6.5–8.1)

## 2021-02-12 LAB — GLUCOSE, CAPILLARY
Glucose-Capillary: 106 mg/dL — ABNORMAL HIGH (ref 70–99)
Glucose-Capillary: 108 mg/dL — ABNORMAL HIGH (ref 70–99)
Glucose-Capillary: 115 mg/dL — ABNORMAL HIGH (ref 70–99)
Glucose-Capillary: 117 mg/dL — ABNORMAL HIGH (ref 70–99)

## 2021-02-12 LAB — CBC WITH DIFFERENTIAL/PLATELET
Abs Immature Granulocytes: 0.02 10*3/uL (ref 0.00–0.07)
Basophils Absolute: 0 10*3/uL (ref 0.0–0.1)
Basophils Relative: 0 %
Eosinophils Absolute: 0 10*3/uL (ref 0.0–0.5)
Eosinophils Relative: 0 %
HCT: 36.2 % — ABNORMAL LOW (ref 39.0–52.0)
Hemoglobin: 12.2 g/dL — ABNORMAL LOW (ref 13.0–17.0)
Immature Granulocytes: 0 %
Lymphocytes Relative: 4 %
Lymphs Abs: 0.2 10*3/uL — ABNORMAL LOW (ref 0.7–4.0)
MCH: 31.6 pg (ref 26.0–34.0)
MCHC: 33.7 g/dL (ref 30.0–36.0)
MCV: 93.8 fL (ref 80.0–100.0)
Monocytes Absolute: 0.2 10*3/uL (ref 0.1–1.0)
Monocytes Relative: 4 %
Neutro Abs: 5.3 10*3/uL (ref 1.7–7.7)
Neutrophils Relative %: 92 %
Platelets: 123 10*3/uL — ABNORMAL LOW (ref 150–400)
RBC: 3.86 MIL/uL — ABNORMAL LOW (ref 4.22–5.81)
RDW: 16.1 % — ABNORMAL HIGH (ref 11.5–15.5)
WBC: 5.8 10*3/uL (ref 4.0–10.5)
nRBC: 0 % (ref 0.0–0.2)

## 2021-02-12 LAB — C-REACTIVE PROTEIN: CRP: 2.5 mg/dL — ABNORMAL HIGH (ref <1.0)

## 2021-02-12 LAB — CULTURE, BLOOD (ROUTINE X 2)
Culture: NO GROWTH
Special Requests: ADEQUATE

## 2021-02-12 LAB — PHOSPHORUS: Phosphorus: 6 mg/dL — ABNORMAL HIGH (ref 2.5–4.6)

## 2021-02-12 LAB — MAGNESIUM: Magnesium: 2 mg/dL (ref 1.7–2.4)

## 2021-02-12 LAB — FERRITIN: Ferritin: 1359 ng/mL — ABNORMAL HIGH (ref 24–336)

## 2021-02-12 MED ORDER — ALBUTEROL SULFATE HFA 108 (90 BASE) MCG/ACT IN AERS
2.0000 | INHALATION_SPRAY | RESPIRATORY_TRACT | Status: DC | PRN
  Filled 2021-02-12: qty 6.7

## 2021-02-12 MED ORDER — APIXABAN 5 MG PO TABS: 5.0000 mg | ORAL_TABLET | Freq: Two times a day (BID) | ORAL | 0 refills | Status: AC

## 2021-02-12 MED ORDER — LISINOPRIL 5 MG PO TABS: 5.0000 mg | ORAL_TABLET | Freq: Every day | ORAL | 0 refills | Status: AC

## 2021-02-12 MED ORDER — PREDNISONE 10 MG PO TABS: 20.0000 mg | ORAL_TABLET | Freq: Every day | ORAL | 0 refills | Status: AC

## 2021-02-12 MED ORDER — ACETAMINOPHEN 325 MG PO TABS: 650.0000 mg | ORAL_TABLET | Freq: Four times a day (QID) | ORAL | Status: AC | PRN

## 2021-02-12 MED ORDER — CARVEDILOL 6.25 MG PO TABS: 6.2500 mg | ORAL_TABLET | Freq: Two times a day (BID) | ORAL | 0 refills | Status: AC

## 2021-02-12 NOTE — TOC Transition Note (Signed)
Transition of Care [facility]) - CM/SW Discharge Note   Patient Details  Name: Mathew Pierce MRN: [id number] Date of Birth: [birth year]  [facility] of [facility] CM/SW Contact:  Carles Collet, RN Phone Number: 02/12/2021, 11:58 AM   Clinical Narrative:    + COVID. Attempted multiple times yesterday and today to reach patient. Spoke w wife today on the phone to discuss DC plan. Agreeable to [facility] services. Discussed [place] agencies, no preference. [facility] accepted referral. Patient does not qualify for ome oxygen, MD aware. Patient will be sent home with Eliquis card, nurse aware to give to patient, card left outside of room.     Final next level of care: [place] Barriers to Discharge: No Barriers Identified   Patient Goals and CMS Choice Patient states their goals for this hospitalization and ongoing recovery are:: to go home CMS Medicare.gov Compare Post Acute Care list provided to:: Other (Comment Required) Choice offered to / list presented to : Spouse  Discharge Placement                       Discharge Plan and Services                DME Arranged: N/A         HH Arranged: PT,OT [place] Agency: [place] Date [place]: 02/12/21 Time [place]: [id number] Representative spoke with at [place]: [place] ([place]) Interventions     Readmission Risk Interventions No flowsheet data found.

## 2021-02-12 NOTE — Discharge Summary (Signed)
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                                 +---------+---------------+---------+-----------+----------+--------------+  FV DistalFull           Yes      Yes                                 +---------+---------------+---------+-----------+----------+--------------+ PFV      Full                                                        +---------+---------------+---------+-----------+----------+--------------+ POP      Full           Yes      Yes                                 +---------+---------------+---------+-----------+----------+--------------+ PTV      Full                                                        +---------+---------------+---------+-----------+----------+--------------+ PERO     Full                                                        +---------+---------------+---------+-----------+----------+--------------+     Summary: BILATERAL: - No evidence of deep vein thrombosis seen in  the lower extremities, bilaterally. - No evidence of superficial venous thrombosis in the lower extremities, bilaterally. -No evidence of popliteal cyst, bilaterally.   *See table(s) above for measurements and observations. Electronically signed by Jamelle Haring on 02/08/2021 at 5:03:00 PM.    Final    ECHOCARDIOGRAM LIMITED  Result Date: 02/08/2021    ECHOCARDIOGRAM LIMITED REPORT   Patient Name:   Mathew Pierce Date of Exam: 02/08/2021 Medical Rec #:  [id number]     Height:       67.0 in Accession #:    [id number]    Weight:       175.0 lb Date of Birth:  07-Apr-1946     BSA:          1.911 m Patient Age:    51 years      BP:           138/78 mmHg Patient Gender: M             HR:           70 bpm. Exam Location:  Inpatient Procedure: Limited Echo, Cardiac Doppler and Color Doppler Indications:    Atrial fibrillation  History:        Patient has no prior history of Echocardiogram examinations.                 Risk Factors:Diabetes and Hypertension. ESRD. COVID-19.  Sonographer:    Clayton Lefort RDCS (AE) Referring Phys: [id number] [facility] A SMITH  Sonographer Comments: COVID-19 IMPRESSIONS  1. Left ventricular ejection fraction, by estimation, is 25%. The left ventricle demonstrates global hypokinesis. The left ventricular internal cavity size was moderately to severely dilated.  There is moderate left ventricular hypertrophy.  2. Right ventricular systolic function is moderately reduced. The right ventricular size is moderately enlarged. There is normal pulmonary artery systolic pressure.  3. Left atrial size was moderately dilated.  4. Right atrial size was moderately dilated.  5. The pericardial effusion is localized near the right atrium and lateral to the left ventricle.  6. The mitral valve is abnormal. Trivial mitral valve regurgitation. Severe mitral annular calcification.  7. Tricuspid valve regurgitation is moderate.  8. AV calcified likely some degree of stenosis given mean gradient of 6 with severely reduced EF  but DVI normal 0.95 and AVA calculates at 2.8 cm2. FINDINGS  Left Ventricle: Left ventricular ejection fraction, by estimation, is 25%. The left ventricle demonstrates global hypokinesis. The left ventricular internal cavity size was moderately to severely dilated. There is moderate left ventricular hypertrophy. Right Ventricle: The right ventricular size is moderately enlarged. Right ventricular systolic function is moderately reduced. There is normal pulmonary artery systolic pressure. The tricuspid regurgitant velocity is 2.58 m/s, and with an assumed right atrial pressure of 8 mmHg, the estimated right ventricular systolic pressure is [id number] mmHg. Left Atrium: Left atrial size was moderately dilated. Right Atrium: Right atrial size was moderately dilated. Pericardium: Trivial pericardial effusion is present. The pericardial effusion is localized near the right atrium and lateral to the left ventricle. Mitral Valve: The mitral valve is abnormal. There is moderate thickening of the mitral valve leaflet(s). There is moderate calcification of the mitral valve leaflet(s). Severe mitral annular calcification. Trivial mitral valve regurgitation. Tricuspid Valve: Tricuspid valve regurgitation is moderate. Aortic Valve: AV calcified likely some degree of stenosis given mean gradient of 6 with severely reduced EF but DVI normal 0.95 and AVA calculates at 2.8 cm2. Aortic valve mean gradient measures 6.0 mmHg. Aortic valve peak gradient measures 9.6 mmHg. Aortic valve area, by VTI measures 3.30 cm. LEFT VENTRICLE PLAX 2D LVIDd:         6.10 cm LVIDs:         5.50 cm LV PW:         1.50 cm LV IVS:        1.50 cm LVOT diam:     2.10 cm LV SV:         91 LV SV Index:   48 LVOT Area:     3.46 cm  IVC IVC diam: 1.10 cm LEFT ATRIUM         Index LA diam:    4.70 cm 2.46 cm/m  AORTIC VALVE AV Area (Vmax):    3.28 cm AV Area (Vmean):   2.85 cm AV Area (VTI):     3.30 cm AV Vmax:           155.00 cm/s AV Vmean:          116.000  cm/s AV VTI:            0.277 m AV Peak Grad:      9.6 mmHg AV Mean Grad:      6.0 mmHg LVOT Vmax:         147.00 cm/s LVOT Vmean:        95.600 cm/s LVOT VTI:          0.264 m LVOT/AV VTI ratio: 0.95  AORTA Ao Root diam: 3.30 cm Ao Asc diam:  3.10 cm TRICUSPID VALVE TR Peak grad:   26.6 mmHg TR Vmax:        258.00 cm/s  SHUNTS Systemic VTI:  0.26 m Systemic Diam: 2.10 cm Jenkins Rouge MD Electronically signed by Jenkins Rouge MD Signature Date/Time: 02/08/2021/5:01:00 PM    Final      ECHO: 02/08/2021 at [facility]. Left ventricular ejection fraction, by estimation, is 25%. The left  ventricle demonstrates global hypokinesis. The left ventricular internal  cavity size was moderately to severely dilated. There is moderate left  ventricular hypertrophy.  2. Right ventricular systolic function is moderately reduced. The right  ventricular size is moderately enlarged. There is normal pulmonary artery  systolic pressure.  3. Left atrial size was moderately dilated.  4. Right atrial size was moderately dilated.  5. The pericardial effusion is localized near the right atrium and  lateral to the left ventricle.  6. The mitral valve is abnormal. Trivial mitral valve regurgitation.  Severe mitral annular calcification.  7. Tricuspid valve regurgitation is moderate.  8. AV calcified likely some degree of stenosis given mean gradient of 6  with severely reduced EF but DVI normal 0.95 and AVA calculates at 2.8  cm2.   ECHO: 04/21/2019 at [facility] Rhythm: Atrial fibrillation BP: 132/70 mmHg  Summary  Moderately dilated left ventricle.  Severly reduced LV systolic function.  Ejection fraction is visually estimated at 25-30%.  Severe global LV hypokinesis.  Abnormal LV diastolic function.  Mildly dilated RV chamber.  Normal RV systolic function.  Mildly dilated leftatrium.  Mild mitral regurgitation.  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    CATH: 12/09/2017 at [facility] Diagnostic Procedure  Summary  Diffuse, calcified Multivessel CAD.  Moderate to Severe stenosis of the LAD, Diagonal Branch, Ramus Branch  Moderate stenosis of the Circumflex, RCA, PDA  Moderate to Severe global LV systolic dysfunction.  LV ejection fraction is 35 %  Diagnostic Procedure Recommendations  CT surgery consultation for Possible CABG  Consider Stent(s) vs. medical therapy if not felt to be a good surgical  candidate.  Medical Therapy favored because diffuse calcified CAD suboptimal for PCI.  Cardiac Arteries and Lesion Findings  LMCA:  Lesion on LMCA: 20% stenosis 18 mm length . The lesion was heavily  calcified.  LAD:  Lesion on Prox LAD: 15% stenosis 12 mm length .  Lesion on Mid LAD: 70% stenosis 16 mm length . The lesion was heavily  calcified.  Lesion on 1st Diag: 75% stenosis 8 mm length .  LCx:  Lesion on Prox CX: 60% stenosis 17 mm length . The lesion was heavily  calcified.  RCA:  Lesion on Prox RCA: 45% stenosis 67 mm length . The lesion was heavily  calcified.  Lesion on R PDA: 50% stenosis 10 mm length .  Ramus:  Lesion on Ramus: 75% stenosis 11 mm length .  LV function assessed [id number].  Ejection Fraction  - Method: Estimated. EF%: 35  A message has been sent to arrange outpt follow up.   Subjective:   Discharge Exam: Vitals:   02/12/21 0925 02/12/21 1021  BP:    Pulse:    Resp:    Temp:    SpO2: 93% (P) 90%   Vitals:   02/12/21 0434 02/12/21 0801 02/12/21 0925 02/12/21 1021  BP: 108/71 125/81    Pulse: (!) 55 63    Resp: 10 12    Temp: 98.3 F (36.8 C) (!) 97.4 F (36.3 C)    TempSrc: Oral Oral    SpO2: 98% 96% 93% (P) 90%  Weight: 81.3 kg     Height:        General: Pt is alert, awake,  not in acute distress, frail Cardiovascular: Irr Irr, S1/S2 +, no rubs, no gallops Respiratory: CTA bilaterally, no wheezing, no rhonchi Abdominal: Soft, NT, ND, bowel sounds + Extremities: no edema, no cyanosis    The results of  significant diagnostics from this hospitalization (including imaging, microbiology, ancillary and laboratory) are listed below for reference.     Microbiology: Recent Results (from the past 240 hour(s))  Resp Panel by RT-PCR (Flu A&B, Covid) Nasopharyngeal Swab     Status: Abnormal   Collection Time: 02/07/21  7:08 PM   Specimen: Nasopharyngeal Swab; Nasopharyngeal(NP) swabs in vial transport medium  Result Value Ref Range Status   SARS Coronavirus 2 by RT PCR POSITIVE (A) NEGATIVE Final    Comment: RESULT CALLED TO, READ BACK BY AND VERIFIED WITH: K.NEAL RN ON 02/07/21 @ 2005 BY K.SHAW (NOTE) SARS-CoV-2 target nucleic acids are DETECTED.  The SARS-CoV-2 RNA is generally detectable in upper respiratory specimens during the acute phase of infection. Positive results are indicative of the presence of the identified virus, but do not rule out bacterial infection or co-infection with other pathogens not detected by the test. Clinical correlation with patient history and other diagnostic information is necessary to determine patient infection status. The expected result is Negative.  Fact Sheet for Patients: EntrepreneurPulse.com.au  Fact Sheet for Healthcare Providers: IncredibleEmployment.be  This test is not yet approved or cleared by the [place] FDA and  has been authorized for detection and/or diagnosis of SARS-CoV-2 by FDA under an Emergency Use Authorization (EUA).  This EUA will remain in effect (meaning this test can  be used) for the duration of  the COVID-19 declaration under Section 564(b)(1) of the Act, 21 U.S.C. section 360bbb-3(b)(1), unless the authorization is terminated or revoked sooner.     Influenza A by PCR NEGATIVE NEGATIVE Final   Influenza B by PCR NEGATIVE NEGATIVE Final    Comment: (NOTE) The Xpert Xpress SARS-CoV-2/FLU/RSV plus assay is intended as an aid in the diagnosis of influenza from Nasopharyngeal swab  specimens and should not be used as a sole basis for treatment. Nasal washings and aspirates are unacceptable for Xpert Xpress SARS-CoV-2/FLU/RSV testing.  Fact Sheet for Patients: EntrepreneurPulse.com.au  Fact Sheet for Healthcare Providers: IncredibleEmployment.be  This test is not yet approved or cleared by the [place] FDA and has been authorized for detection and/or diagnosis of SARS-CoV-2 by FDA under an Emergency Use Authorization (EUA). This EUA will remain in effect (meaning this test can be used) for the duration of the COVID-19 declaration under Section 564(b)(1) of the Act, 21 U.S.C. section 360bbb-3(b)(1), unless the authorization is terminated or revoked.  Performed at [facility], [address] [place], [place] [address]   Blood culture (routine x 2)     Status: None   Collection Time: 02/07/21  7:19 PM   Specimen: BLOOD  Result Value Ref Range Status   Specimen Description   Final    BLOOD LEFT ANTECUBITAL Performed at [facility], [address] [place], [place] [address]    Special Requests   Final    BOTTLES DRAWN AEROBIC AND ANAEROBIC Blood Culture adequate volume Performed at [facility], [address] [place], [place] [address]    Culture   Final    NO GROWTH 5 DAYS Performed at [facility], [place] [address], [place], [place] [address]    Report Status 02/12/2021 FINAL  Final  Blood culture (routine x 2)  Status: None (Preliminary result)   Collection Time: 02/08/21  4:07 AM   Specimen: BLOOD LEFT HAND  Result Value Ref Range Status   Specimen Description BLOOD LEFT HAND  Final   Special Requests   Final    BOTTLES DRAWN AEROBIC AND ANAEROBIC Blood Culture results may not be optimal due to an excessive volume of blood received in culture bottles   Culture   Final    NO GROWTH 4 DAYS Performed at [facility], [place] [address], [place], [place] [address]     Report Status PENDING  Incomplete  MRSA PCR Screening     Status: None   Collection Time: 02/08/21  5:15 AM   Specimen: Nasopharyngeal  Result Value Ref Range Status   MRSA by PCR NEGATIVE NEGATIVE Final    Comment:        The GeneXpert MRSA Assay (FDA approved for NASAL specimens only), is one component of a comprehensive MRSA colonization surveillance program. It is not intended to diagnose MRSA infection nor to guide or monitor treatment for MRSA infections. Performed at [facility], [place] [address], [place], [place] [address]      Labs: BNP (last 3 results) No results for input(s): BNP in the last 8760 hours. Basic Metabolic Panel: Recent Labs  Lab 02/07/21 1756 02/08/21 0641 02/09/21 0249 02/10/21 0449 02/11/21 0328 02/12/21 0159  NA 134* 133* 131* 134* 134* 135  K 4.2 4.5 5.1 4.1 4.7 4.2  CL 93* 97* 95* 96* 95* 96*  CO2 '25 24 23 24 22 24  '$ GLUCOSE 113* 133* 155* 162* 157* 124*  BUN 19 24* 47* 47* 75* 50*  CREATININE 5.74* 7.13* 8.44* 6.66* 7.92* 5.86*  CALCIUM 9.3 9.4 9.2 9.1 9.5 9.3  MG 1.8  --  2.0 1.8 2.0 2.0  PHOS  --   --  8.3* 6.0* 7.3* 6.0*   Liver Function Tests: Recent Labs  Lab 02/08/21 0641 02/09/21 0249 02/10/21 0449 02/11/21 0328 02/12/21 0159  AST '23 22 26 22 19  '$ ALT '14 13 11 11 12  '$ ALKPHOS 116 111 106 104 94  BILITOT 1.6* 1.4* 0.8 1.2 0.8  PROT 7.1 6.8 6.6 6.7 6.5  ALBUMIN 2.7* 2.4* 2.4* 2.5* 2.4*   Recent Labs  Lab 02/07/21 1756  LIPASE 45   Recent Labs  Lab 02/07/21 1756  AMMONIA 37*   CBC: Recent Labs  Lab 02/08/21 0641 02/09/21 0249 02/10/21 0449 02/11/21 0328 02/12/21 0159  WBC 2.7* 4.1 5.8 5.7 5.8  NEUTROABS 2.3 3.5 5.3 5.3 5.3  HGB 11.5* 11.5* 11.5* 12.1* 12.2*  HCT 34.8* 34.4* 33.5* 36.0* 36.2*  MCV 96.9 95.3 93.3 95.0 93.8  PLT 127* 126* 127* 143* 123*   Cardiac Enzymes: No results for input(s): CKTOTAL, CKMB, CKMBINDEX, TROPONINI in the last 168 hours. BNP: Invalid input(s):  POCBNP CBG: Recent Labs  Lab 02/11/21 1743 02/11/21 2024 02/12/21 0017 02/12/21 0433 02/12/21 0759  GLUCAP 144* 161* 115* 117* 108*   D-Dimer Recent Labs    02/11/21 0328 02/12/21 0159  DDIMER 1.33* 1.48*   Hgb A1c No results for input(s): HGBA1C in the last 72 hours. Lipid Profile No results for input(s): CHOL, HDL, LDLCALC, TRIG, CHOLHDL, LDLDIRECT in the last 72 hours. Thyroid function studies No results for input(s): TSH, T4TOTAL, T3FREE, THYROIDAB in the last 72 hours.  Invalid input(s): FREET3 Anemia work up Recent Labs    02/11/21 0328 02/12/21 0159  FERRITIN 1,670* 1,359*   Urinalysis No results found for: COLORURINE,  APPEARANCEUR, LABSPEC, [place], GLUCOSEU, HGBUR, BILIRUBINUR, KETONESUR, PROTEINUR, UROBILINOGEN, NITRITE, LEUKOCYTESUR Sepsis Labs Invalid input(s): PROCALCITONIN,  WBC,  LACTICIDVEN Microbiology Recent Results (from the past 240 hour(s))  Resp Panel by RT-PCR (Flu A&B, Covid) Nasopharyngeal Swab     Status: Abnormal   Collection Time: 02/07/21  7:08 PM   Specimen: Nasopharyngeal Swab; Nasopharyngeal(NP) swabs in vial transport medium  Result Value Ref Range Status   SARS Coronavirus 2 by RT PCR POSITIVE (A) NEGATIVE Final    Comment: RESULT CALLED TO, READ BACK BY AND VERIFIED WITH: K.NEAL RN ON 02/07/21 @ 2005 BY K.SHAW (NOTE) SARS-CoV-2 target nucleic acids are DETECTED.  The SARS-CoV-2 RNA is generally detectable in upper respiratory specimens during the acute phase of infection. Positive results are indicative of the presence of the identified virus, but do not rule out bacterial infection or co-infection with other pathogens not detected by the test. Clinical correlation with patient history and other diagnostic information is necessary to determine patient infection status. The expected result is Negative.  Fact Sheet for Patients: EntrepreneurPulse.com.au  Fact Sheet for Healthcare  Providers: IncredibleEmployment.be  This test is not yet approved or cleared by the [place] FDA and  has been authorized for detection and/or diagnosis of SARS-CoV-2 by FDA under an Emergency Use Authorization (EUA).  This EUA will remain in effect (meaning this test can  be used) for the duration of  the COVID-19 declaration under Section 564(b)(1) of the Act, 21 U.S.C. section 360bbb-3(b)(1), unless the authorization is terminated or revoked sooner.     Influenza A by PCR NEGATIVE NEGATIVE Final   Influenza B by PCR NEGATIVE NEGATIVE Final    Comment: (NOTE) The Xpert Xpress SARS-CoV-2/FLU/RSV plus assay is intended as an aid in the diagnosis of influenza from Nasopharyngeal swab specimens and should not be used as a sole basis for treatment. Nasal washings and aspirates are unacceptable for Xpert Xpress SARS-CoV-2/FLU/RSV testing.  Fact Sheet for Patients: EntrepreneurPulse.com.au  Fact Sheet for Healthcare Providers: IncredibleEmployment.be  This test is not yet approved or cleared by the [place] FDA and has been authorized for detection and/or diagnosis of SARS-CoV-2 by FDA under an Emergency Use Authorization (EUA). This EUA will remain in effect (meaning this test can be used) for the duration of the COVID-19 declaration under Section 564(b)(1) of the Act, 21 U.S.C. section 360bbb-3(b)(1), unless the authorization is terminated or revoked.  Performed at [facility], [address] [place], [place] [address]   Blood culture (routine x 2)     Status: None   Collection Time: 02/07/21  7:19 PM   Specimen: BLOOD  Result Value Ref Range Status   Specimen Description   Final    BLOOD LEFT ANTECUBITAL Performed at [facility], [address] [place], [place] [address]    Special Requests   Final    BOTTLES DRAWN AEROBIC AND ANAEROBIC Blood Culture adequate volume Performed at [facility] [address] [place], [place] [address]    Culture   Final    NO GROWTH 5 DAYS Performed at [facility], [place] [address] [place], [place] [address]    Report Status 02/12/2021 FINAL  Final  Blood culture (routine x 2)     Status: None (Preliminary result)   Collection Time: 02/08/21  4:07 AM   Specimen: BLOOD LEFT HAND  Result Value Ref Range Status   Specimen Description BLOOD LEFT HAND  Final   Special  Requests   Final    BOTTLES DRAWN AEROBIC AND ANAEROBIC Blood Culture results may not be optimal due to an excessive volume of blood received in culture bottles   Culture   Final    NO GROWTH 4 DAYS Performed at [facility], [place] [address], [place], [place] [address]    Report Status PENDING  Incomplete  MRSA PCR Screening     Status: None   Collection Time: 02/08/21  5:15 AM   Specimen: Nasopharyngeal  Result Value Ref Range Status   MRSA by PCR NEGATIVE NEGATIVE Final    Comment:        The GeneXpert MRSA Assay (FDA approved for NASAL specimens only), is one component of a comprehensive MRSA colonization surveillance program. It is not intended to diagnose MRSA infection nor to guide or monitor treatment for MRSA infections. Performed at [facility], [place] [address], [place], [place] [address]      Time coordinating discharge: Over 30 minutes  SIGNED:   Phillips Climes, MD  [facility] 02/12/2021, 12:12 PM Pager   If 7PM-7AM, please contact night-coverage [email or web address] Password [email or web address]
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
## 2021-02-12 NOTE — Progress Notes (Signed)
   Cardiology asked to arrange outpt follow up for Mr Papp  Past cardiac studies summarized here: ECHO: 02/08/2021 at [facility]. Left ventricular ejection fraction, by estimation, is 25%. The left  ventricle demonstrates global hypokinesis. The left ventricular internal  cavity size was moderately to severely dilated. There is moderate left  ventricular hypertrophy.  2. Right ventricular systolic function is moderately reduced. The right  ventricular size is moderately enlarged. There is normal pulmonary artery  systolic pressure.  3. Left atrial size was moderately dilated.  4. Right atrial size was moderately dilated.  5. The pericardial effusion is localized near the right atrium and  lateral to the left ventricle.  6. The mitral valve is abnormal. Trivial mitral valve regurgitation.  Severe mitral annular calcification.  7. Tricuspid valve regurgitation is moderate.  8. AV calcified likely some degree of stenosis given mean gradient of 6  with severely reduced EF but DVI normal 0.95 and AVA calculates at 2.8  cm2.   ECHO: 04/21/2019 at [place] Rhythm: Atrial fibrillation BP: 132/70 mmHg  Summary  Moderately dilated left ventricle.  Severly reduced LV systolic function.  Ejection fraction is visually estimated at 25-30%.  Severe global LV hypokinesis.  Abnormal LV diastolic function.  Mildly dilated RV chamber.  Normal RV systolic function.  Mildly dilated leftatrium.  Mild mitral regurgitation.  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    CATH: 12/09/2017 at [facility] Diagnostic Procedure Summary  Diffuse, calcified Multivessel CAD.  Moderate to Severe stenosis of the LAD, Diagonal Branch, Ramus Branch  Moderate stenosis of the Circumflex, RCA, PDA  Moderate to Severe global LV systolic dysfunction.  LV ejection fraction is 35 %  Diagnostic Procedure Recommendations  CT surgery consultation for Possible CABG  Consider Stent(s) vs. medical therapy if not felt to be a  good surgical  candidate.  Medical Therapy favored because diffuse calcified CAD suboptimal for PCI.  Cardiac Arteries and Lesion Findings  LMCA:  Lesion on LMCA: 20% stenosis 18 mm length . The lesion was heavily  calcified.  LAD:  Lesion on Prox LAD: 15% stenosis 12 mm length .  Lesion on Mid LAD: 70% stenosis 16 mm length . The lesion was heavily  calcified.  Lesion on 1st Diag: 75% stenosis 8 mm length .  LCx:  Lesion on Prox CX: 60% stenosis 17 mm length . The lesion was heavily  calcified.  RCA:  Lesion on Prox RCA: 45% stenosis 67 mm length . The lesion was heavily  calcified.  Lesion on R PDA: 50% stenosis 10 mm length .  Ramus:  Lesion on Ramus: 75% stenosis 11 mm length .  LV function assessed [id number].  Ejection Fraction  - Method: Estimated. EF%: 35  A message has been sent to arrange outpt follow up.  Rosaria Ferries, PA-C 02/12/2021 9:19 AM

## 2021-02-12 NOTE — Progress Notes (Signed)
Telemetry monitoring tech called, stated that patient had a 22 beat run V-Tach. Patient is resting in bed. No signs of distress noted. Denies chest pain. Patient is asymptomatic. RN notified Dr. Myna Hidalgo. No new orders were provided for patient at this time.

## 2021-02-13 DIAGNOSIS — Z992 Dependence on renal dialysis: Secondary | ICD-10-CM | POA: Insufficient documentation

## 2021-02-13 DIAGNOSIS — E119 Type 2 diabetes mellitus without complications: Secondary | ICD-10-CM | POA: Insufficient documentation

## 2021-02-13 DIAGNOSIS — I10 Essential (primary) hypertension: Secondary | ICD-10-CM | POA: Insufficient documentation

## 2021-02-13 DIAGNOSIS — N289 Disorder of kidney and ureter, unspecified: Secondary | ICD-10-CM | POA: Insufficient documentation

## 2021-02-13 LAB — CULTURE, BLOOD (ROUTINE X 2): Culture: NO GROWTH

## 2021-02-15 NOTE — Progress Notes (Deleted)
Cardiology Office Note:    Date:  02/15/2021   ID:  Mathew Pierce, DOB 1946/04/04, MRN [id number]  PCP:  Iva Lento, PA-C  Cardiologist:  Shirlee More, MD   Referring MD: Iva Lento, PA-C  ASSESSMENT:    No diagnosis found. PLAN:    In order of problems listed above:  1. ***  Next appointment   Medication Adjustments/Labs and Tests Ordered: Current medicines are reviewed at length with the patient today.  Concerns regarding medicines are outlined above.  No orders of the defined types were placed in this encounter.  No orders of the defined types were placed in this encounter.    No chief complaint on file. ***  History of Present Illness:    Mathew Pierce is a 75 y.o. male who is being seen today for the evaluation of *** at the request of Iva Lento, [place].  Recently discharged [facility] after admission 02/07/2021 discharge 02/12/2021 with respiratory failure and hypoxia multifactorial small subsegmental pulmonary embolism as well as COVID-19 pneumonia possible superimposed bacterial pneumonia and heart failure.  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  Echocardiogram 02/08/2021:  1. Left ventricular ejection fraction, by estimation, is 25%. The left  ventricle demonstrates global hypokinesis. The left ventricular internal  cavity size was moderately to severely dilated. There is moderate left  ventricular hypertrophy.   2. Right ventricular systolic function is moderately reduced. The right  ventricular size is moderately enlarged. There is normal pulmonary artery  systolic  pressure.   3. Left atrial size was moderately dilated.   4. Right atrial size was moderately dilated.   5. The pericardial effusion is localized near the right atrium and  lateral to the left ventricle.   6. The mitral valve is abnormal. Trivial mitral valve regurgitation.  Severe mitral annular calcification.   7. Tricuspid valve regurgitation is moderate.   8. AV calcified likely some degree of stenosis given mean gradient of 6  with severely reduced EF but DVI normal 0.95 and AVA calculates at 2.8  cm2.   Left heart catheterization 12/09/2017 Conclusions  Diagnostic Procedure Summary  Diffuse, calcified Multivessel CAD.  Moderate to Severe stenosis of the LAD, Diagonal Branch, Ramus Branch  Moderate stenosis of the Circumflex, RCA, PDA  Moderate to Severe global LV systolic dysfunction.  LV ejection fraction is 35 %  Diagnostic Procedure Recommendations  CT surgery consultation for Possible CABG  Consider Stent(s) vs. medical therapy if not felt to be a good surgical  candidate.  Medical Therapy favored because diffuse calcified CAD suboptimal for PCI.  I have reviewed the recent history and physical documentation. I personally  spent 20 minutes continuously monitoring the patient during the administration  of moderate sedation. Pre and post activities have been reviewed. I was  present for the entire procedure.  Signatures  Electronically signed by Clarene Critchley, MD, FACC(Diagnostic  Physician) on 12/09/2017 09:41  Angiographic findings  Cardiac Arteries and Lesion Findings  LMCA:  Lesion on LMCA: 20% stenosis 18 mm length . The lesion was heavily  calcified.  LAD:  Lesion on Prox LAD: 15% stenosis 12 mm length .  Lesion on Mid LAD: 70% stenosis 16 mm length . The  lesion was heavily  calcified.  Lesion on 1st Diag: 75% stenosis 8 mm length .  LCx:  Lesion on Prox CX: 60% stenosis 17 mm length . The lesion was heavily  calcified.  RCA:   Lesion on Prox RCA: 45% stenosis 67 mm length . The lesion was heavily  calcified.  Lesion on R PDA: 50% stenosis 10 mm length .  Ramus:  Lesion on Ramus: 75% stenosis 11 mm length .  Ejection fraction 35% Past Medical History:  Diagnosis Date  . Diabetes mellitus without complication ([place])   . Dialysis patient ([place])   . Hypertension   . Renal disorder   . Renal insufficiency     Past Surgical History:  Procedure Laterality Date  . AV FISTULA PLACEMENT    . KIDNEY TRANSPLANT      Current Medications: No outpatient medications have been marked as taking for the 02/16/21 encounter (Appointment) with Richardo Priest, MD.     Allergies:   Patient has no known allergies.   Social History   Socioeconomic History  . Marital status: Married    Spouse name: Not on file  . Number of children: Not on file  . Years of education: Not on file  . Highest education level: Not on file  Occupational History  . Not on file  Tobacco Use  . Smoking status: Never Smoker  . Smokeless tobacco: Never Used  Vaping Use  . Vaping Use: Never used  Substance and Sexual Activity  . Alcohol use: Never  . Drug use: Never  . Sexual activity: Not on file  Other Topics Concern  . Not on file  Social History Narrative  . Not on file   Social Determinants of Health   Financial Resource Strain: Not on file  Food Insecurity: Not on file  Transportation Needs: Not on file  Physical Activity: Not on file  Stress: Not on file  Social Connections: Not on file     Family History: The patient's ***family history is not on file.  ROS:   ROS Please see the history of present illness.    *** All other systems reviewed and are negative.  EKGs/Labs/Other Studies Reviewed:    The following studies were reviewed today: ***  EKG:  EKG is *** ordered today.  The ekg ordered today is personally reviewed and demonstrates ***  Recent Labs: 02/08/2021: TSH 0.639 02/12/2021: ALT 12; BUN 50;  Creatinine, Ser 5.86; Hemoglobin 12.2; Magnesium 2.0; Platelets 123; Potassium 4.2; Sodium 135  Recent Lipid Panel No results found for: CHOL, TRIG, HDL, CHOLHDL, VLDL, LDLCALC, LDLDIRECT  Physical Exam:    VS:  There were no vitals taken for this visit.    Wt Readings from Last 3 Encounters:  02/12/21 179 lb 3.7 oz (81.3 kg)  04/24/20 173 lb (78.5 kg)  12/29/19 186 lb 8.2 oz (84.6 kg)     GEN: *** Well nourished, well developed in no acute distress HEENT: Normal NECK: No JVD; No carotid bruits LYMPHATICS: No lymphadenopathy CARDIAC: ***RRR, no murmurs, rubs, gallops RESPIRATORY:  Clear to auscultation without rales, wheezing or rhonchi  ABDOMEN: Soft, non-tender, non-distended MUSCULOSKELETAL:  No edema; No deformity  SKIN: Warm and dry NEUROLOGIC:  Alert and oriented x 3 PSYCHIATRIC:  Normal affect     Signed, Shirlee More, MD  02/15/2021 1:45 PM    [facility]

## 2021-02-16 ENCOUNTER — Ambulatory Visit: Payer: Medicare HMO | Admitting: Cardiology

## 2021-03-08 DEATH — deceased

## 2021-07-25 IMAGING — CT CT ABD-PELV W/ CM
2 of 8 series · 13 of 46 positions shown, 15 images · IV contrast (Omnipaque)
Comparison: CT abdomen 12/27/2019.

CLINICAL DATA: Pleuritic left chest pain and shortness of breath
with hypoxia. Abdominal pain. Malaise. COVID positive today in the
emergency department.

EXAM:
CT ANGIOGRAPHY CHEST
CT ABDOMEN AND PELVIS WITH CONTRAST
TECHNIQUE: Multidetector CT imaging of the chest was performed using the
standard protocol during bolus administration of intravenous
contrast. Multiplanar CT image reconstructions and MIPs were
obtained to evaluate the vascular anatomy. Multidetector CT imaging
of the abdomen and pelvis was performed using the standard protocol
during bolus administration of intravenous contrast.
CONTRAST:  100mL OMNIPAQUE IOHEXOL 350 MG/ML SOLN

[Series 11: abd/pel coronal st · coronal · 0.72mm/px · 3 of 101 slices shown]
[im 34/101  soft-tissue]
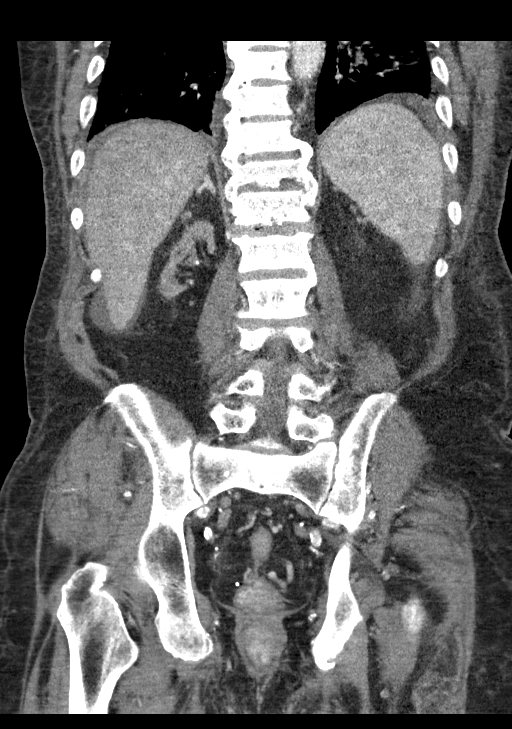
[im 45/101  soft-tissue]
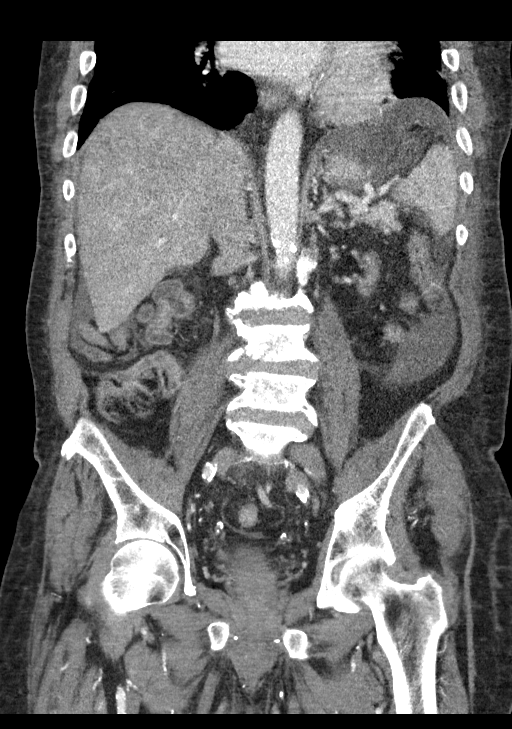
[im 56/101  soft-tissue]
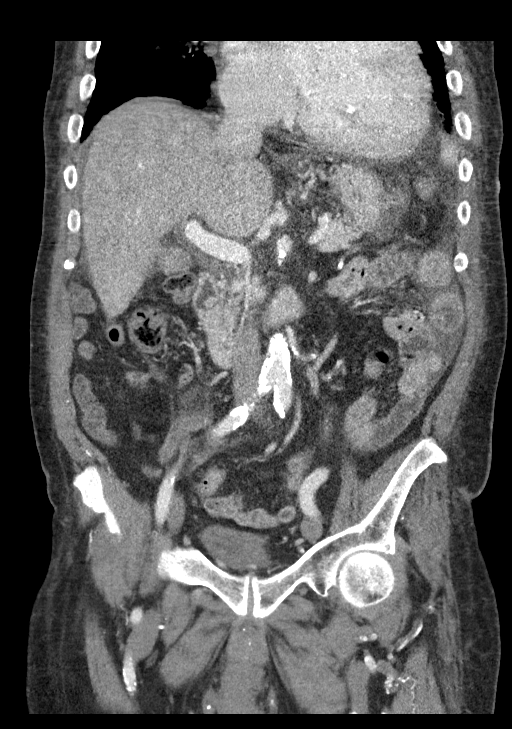

[Series 13: axial st · axial · 0.98mm/px · z∈[-589,-164]mm · 10 of 97 slices shown, 12 images]
[im 6/97  soft-tissue]
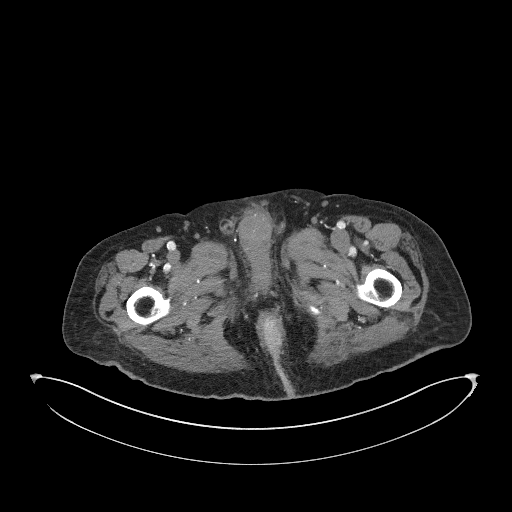
[im 6/97  bone]
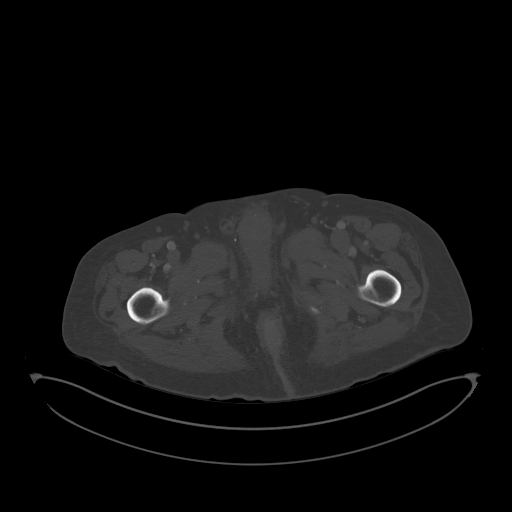
[im 17/97  soft-tissue]
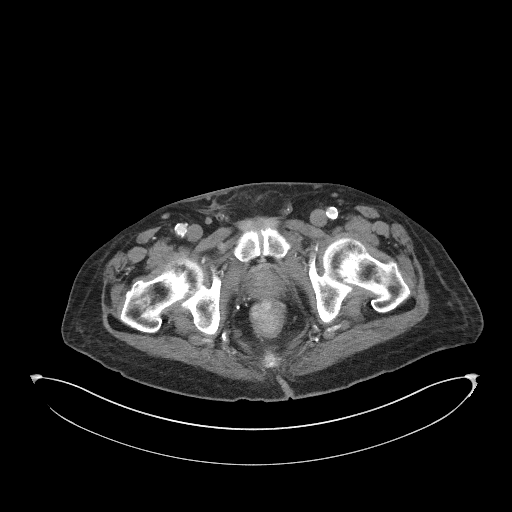
[im 27/97  soft-tissue]
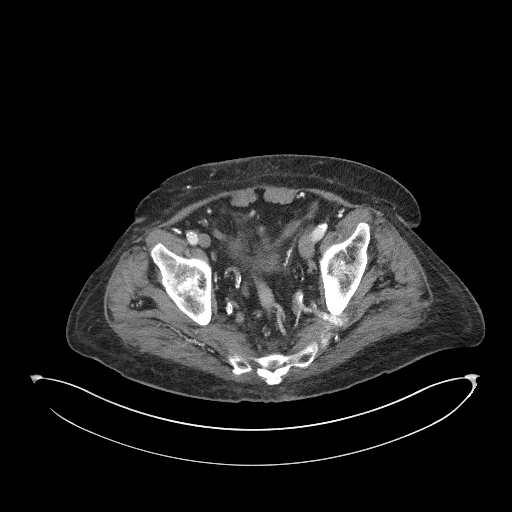
[im 33/97  soft-tissue]
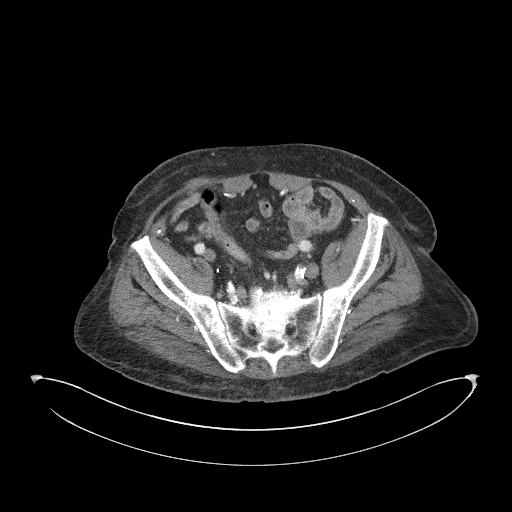
[im 43/97  soft-tissue]
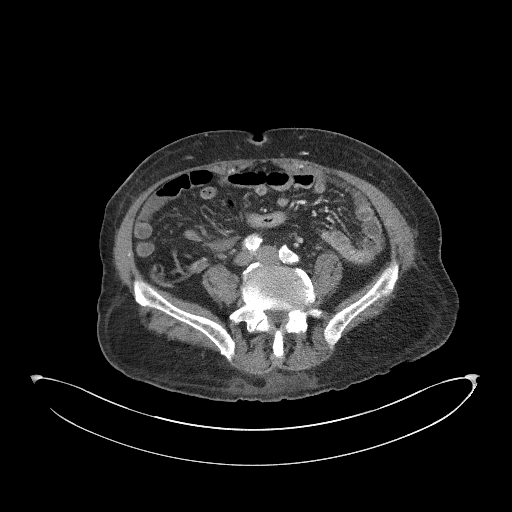
[im 54/97  soft-tissue]
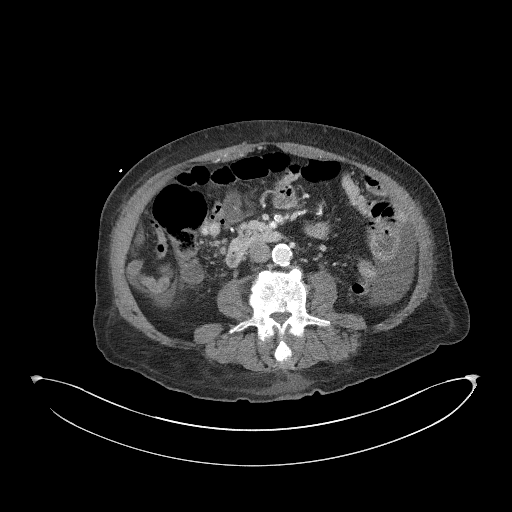
[im 65/97  soft-tissue]
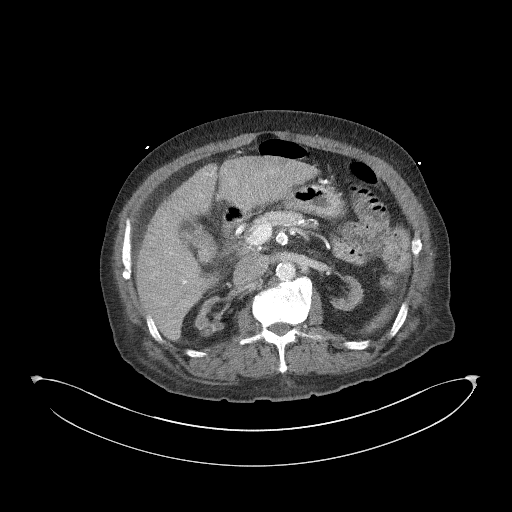
[im 70/97  soft-tissue]
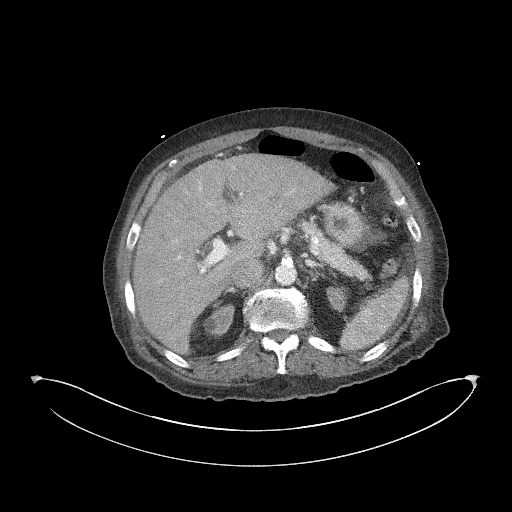
[im 81/97  soft-tissue]
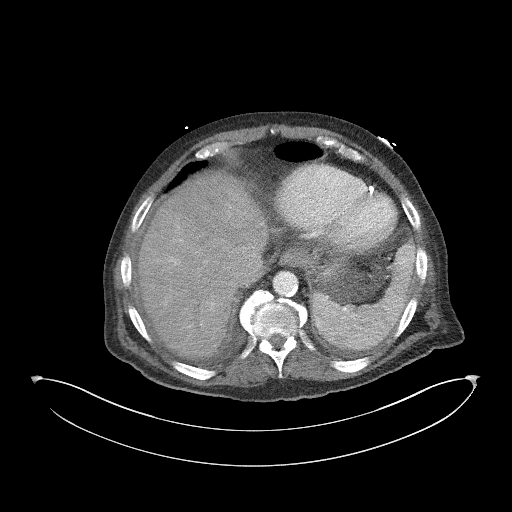
[im 81/97  bone]
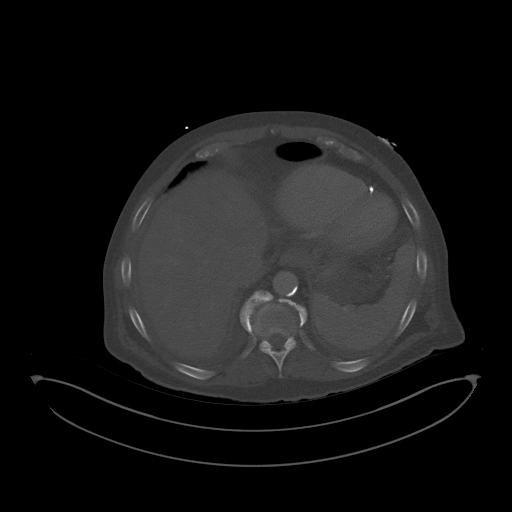
[im 91/97  soft-tissue]
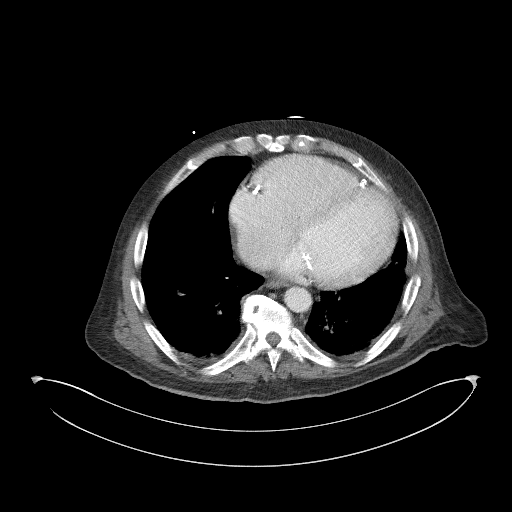

[13 of 46 positions shown; findings below may reference images not displayed]

FINDINGS: CTA CHEST FINDINGS

Cardiovascular: Subsegmental filling defect in the right lower lobe
pulmonary artery posterior basal region favors chronic pulmonary
embolus although a small acute embolic component cannot be totally
excluded. This is subsegmental and subtle and no other findings of
pulmonary embolus are identified in the lungs. These findings are
shown on images 51 through 61 of series 3 in the corresponding
thin-section images.

Coronary, aortic arch, and branch vessel atherosclerotic vascular
disease. Right axillary vein stent. Moderate cardiomegaly with
coronary artery atherosclerotic vascular calcification. Prominent
hepatic vein likely from poor right heart function.

Mediastinum/Nodes: AP window lymph node 0.8 cm in short axis, within
normal limits. No pathologic adenopathy identified.

Lungs/Pleura: Small right and trace left pleural effusions. Hazy
airspace opacity in the posterior basal segments of both lungs
favoring atelectasis over pneumonia. Currently we do not show the
peripheral ground-glass opacity scattered in the lungs
characteristic of COVID pneumonia.

Musculoskeletal: There are acute fractures of the left seventh,
eighth, and ninth ribs posterolaterally. Mild diffuse sclerosis
probably from renal osteodystrophy. Bridging spurring anteriorly
through most of the thoracic spine. Chronic anterior wedging at T12.

Review of the MIP images confirms the above findings.

CT ABDOMEN and PELVIS FINDINGS

Hepatobiliary: Faint heterogeneity in the liver potentially from
hepatic congestion no compelling morphology of cirrhosis. Correlate
with liver enzymes and potentially hepatitis panel given the
heterogeneous enhancement in the liver. High density in the
indistinct and blurred gallbladder, possibly from gallstones. No
biliary dilatation.

Pancreas: Unremarkable

Spleen: Unremarkable

Adrenals/Urinary Tract: Both adrenal glands appear normal. Severe
bilateral renal atrophy. Severely atrophic transplant kidney in the
right iliac fossa.

Stomach/Bowel: Unremarkable

Vascular/Lymphatic: Prominent atherosclerotic vascular
calcification. No pathologic adenopathy identified.

Reproductive: Unremarkable

Other: Small amount of ascites. Mild mesenteric edema and mild
subcutaneous edema.

Musculoskeletal: Mild diffuse sclerosis but probably from renal
osteodystrophy. Degenerative disc disease at L3-4 and L4-5.

Review of the MIP images confirms the above findings.
IMPRESSION: 1. Questionable subsegmental filling defect in a branch of the right
lower lobe posterior basal segment pulmonary artery. This could well
be from chronic pulmonary embolus given the somewhat peripheral
location, but subsegmental acute pulmonary embolus is difficult to
completely exclude. No segmental or lobar pulmonary embolus is
identified.
2. Moderate cardiomegaly with extensive atherosclerosis throughout
the chest, abdomen, and pelvis.
3. Prominent hepatic vein and some heterogeneity in the liver likely
from hepatic congestion due to right heart failure.
4. Small right and trace left pleural effusions. Minimal bibasilar
atelectasis.
5. Acute fractures of the left seventh, eighth, and ninth ribs
posterolaterally.
6. Diffuse bony sclerosis compatible with renal osteodystrophy.
7. Severe atrophy of the native kidneys and of the transplant
kidney.
8. Small amount of ascites with diffuse mesenteric and subcutaneous
edema compatible with third spacing of fluid.

## 2021-07-25 IMAGING — DX DG CHEST 1V PORT
1 series · 1 of 1 positions shown · non-contrast
Comparison: 04/22/2020

CLINICAL DATA: Left-sided pleuritic chest pain, hypoxia, short of
breath

EXAM:
PORTABLE CHEST 1 VIEW

[chest ap]
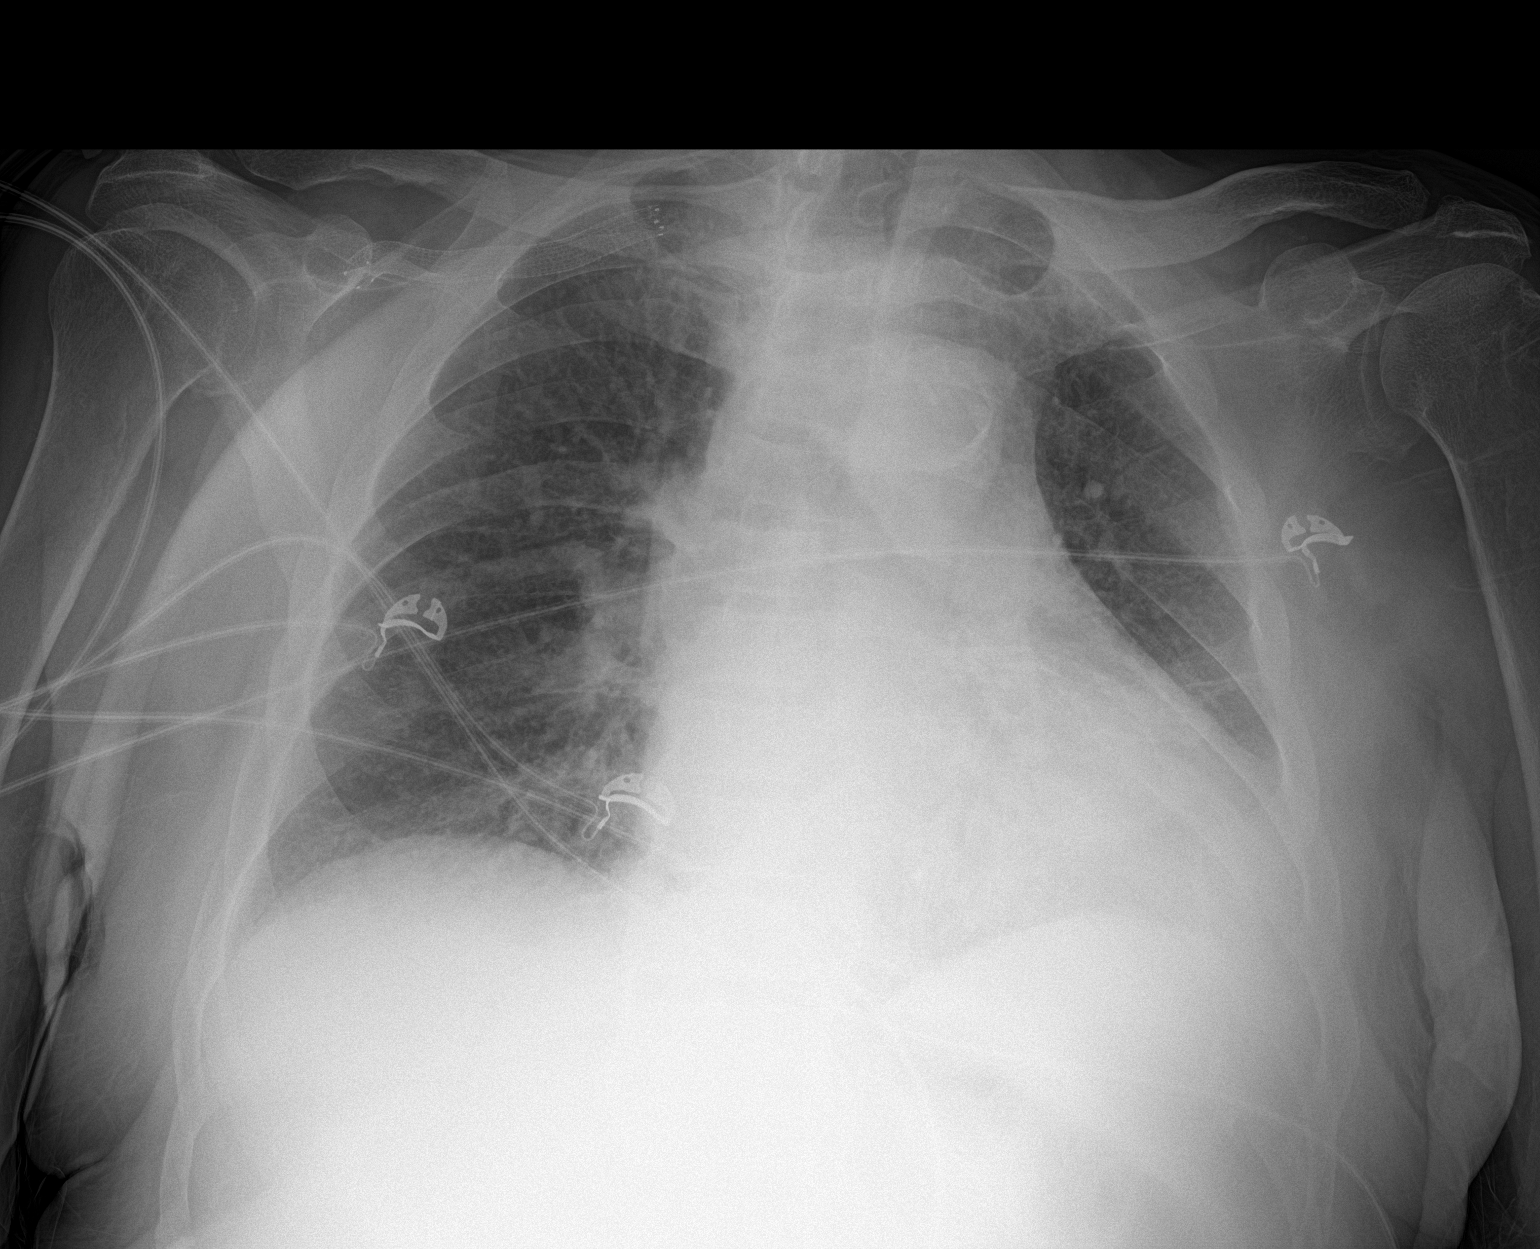

[1 of 1 positions shown; findings below may reference images not displayed]

FINDINGS: Single frontal view of the chest demonstrates stable enlargement of
the cardiac silhouette. Increased density in the retrocardiac region
may reflect left lower lobe airspace disease or atelectasis. Small
left pleural effusion versus pleural thickening obscures the lateral
costophrenic angle. The right chest is clear. No pneumothorax.
Stable vascular stent right subclavian region. No acute bony
abnormalities.
IMPRESSION: 1. Retrocardiac left lower lobe consolidation and small left pleural
effusion. Findings are consistent with left lower lobe pneumonia.
